# Patient Record
Sex: Female | Born: 1940 | Race: Black or African American | Hispanic: No | Marital: Married | Smoking: Never smoker
Health system: Southern US, Community
[De-identification: ages and names within clinical notes are randomized; demographics above are authoritative.]

## PROBLEM LIST (undated history)

## (undated) DIAGNOSIS — K219 Gastro-esophageal reflux disease without esophagitis: Secondary | ICD-10-CM

## (undated) DIAGNOSIS — C9000 Multiple myeloma not having achieved remission: Secondary | ICD-10-CM

## (undated) DIAGNOSIS — I10 Essential (primary) hypertension: Secondary | ICD-10-CM

## (undated) HISTORY — PX: ABDOMINAL HYSTERECTOMY: SHX81

## (undated) HISTORY — DX: Essential (primary) hypertension: I10

## (undated) HISTORY — DX: Gastro-esophageal reflux disease without esophagitis: K21.9

## (undated) HISTORY — PX: APPENDECTOMY: SHX54

## (undated) HISTORY — DX: Multiple myeloma not having achieved remission: C90.00

---

## 2003-11-15 ENCOUNTER — Ambulatory Visit: Payer: Self-pay | Admitting: Internal Medicine

## 2003-11-15 ENCOUNTER — Other Ambulatory Visit: Payer: Self-pay

## 2003-11-15 ENCOUNTER — Emergency Department: Payer: Self-pay | Admitting: Emergency Medicine

## 2003-11-18 ENCOUNTER — Inpatient Hospital Stay: Payer: Self-pay | Admitting: Internal Medicine

## 2003-12-16 ENCOUNTER — Ambulatory Visit: Payer: Self-pay | Admitting: Internal Medicine

## 2003-12-19 ENCOUNTER — Inpatient Hospital Stay: Payer: Self-pay | Admitting: Internal Medicine

## 2004-01-15 ENCOUNTER — Ambulatory Visit: Payer: Self-pay | Admitting: Internal Medicine

## 2004-01-28 ENCOUNTER — Ambulatory Visit: Payer: Self-pay | Admitting: Family Medicine

## 2004-02-15 ENCOUNTER — Ambulatory Visit: Payer: Self-pay | Admitting: Internal Medicine

## 2004-02-19 ENCOUNTER — Ambulatory Visit: Payer: Self-pay | Admitting: Neurological Surgery

## 2004-03-17 ENCOUNTER — Ambulatory Visit: Payer: Self-pay | Admitting: Internal Medicine

## 2004-05-11 ENCOUNTER — Ambulatory Visit: Payer: Self-pay | Admitting: Internal Medicine

## 2004-05-15 ENCOUNTER — Ambulatory Visit: Payer: Self-pay | Admitting: Internal Medicine

## 2004-06-14 ENCOUNTER — Ambulatory Visit: Payer: Self-pay | Admitting: Internal Medicine

## 2004-07-15 ENCOUNTER — Ambulatory Visit: Payer: Self-pay | Admitting: Internal Medicine

## 2004-08-06 ENCOUNTER — Ambulatory Visit: Payer: Self-pay | Admitting: Surgery

## 2004-08-14 ENCOUNTER — Ambulatory Visit: Payer: Self-pay | Admitting: Internal Medicine

## 2004-09-14 ENCOUNTER — Ambulatory Visit: Payer: Self-pay | Admitting: Internal Medicine

## 2004-10-15 ENCOUNTER — Ambulatory Visit: Payer: Self-pay | Admitting: Internal Medicine

## 2004-11-14 ENCOUNTER — Ambulatory Visit: Payer: Self-pay | Admitting: Internal Medicine

## 2004-11-18 ENCOUNTER — Inpatient Hospital Stay: Payer: Self-pay | Admitting: Internal Medicine

## 2004-12-15 ENCOUNTER — Ambulatory Visit: Payer: Self-pay | Admitting: Internal Medicine

## 2005-01-14 ENCOUNTER — Ambulatory Visit: Payer: Self-pay | Admitting: Internal Medicine

## 2005-02-14 ENCOUNTER — Ambulatory Visit: Payer: Self-pay | Admitting: Internal Medicine

## 2005-03-17 ENCOUNTER — Ambulatory Visit: Payer: Self-pay | Admitting: Internal Medicine

## 2005-04-14 ENCOUNTER — Ambulatory Visit: Payer: Self-pay | Admitting: Internal Medicine

## 2005-05-05 ENCOUNTER — Ambulatory Visit: Payer: Self-pay | Admitting: Family Medicine

## 2005-05-26 ENCOUNTER — Ambulatory Visit: Payer: Self-pay | Admitting: Family Medicine

## 2005-05-31 ENCOUNTER — Ambulatory Visit: Payer: Self-pay | Admitting: Internal Medicine

## 2005-06-09 ENCOUNTER — Inpatient Hospital Stay: Payer: Self-pay | Admitting: Internal Medicine

## 2005-06-14 ENCOUNTER — Ambulatory Visit: Payer: Self-pay | Admitting: Internal Medicine

## 2005-07-07 ENCOUNTER — Ambulatory Visit: Payer: Self-pay | Admitting: Pain Medicine

## 2005-07-15 ENCOUNTER — Ambulatory Visit: Payer: Self-pay | Admitting: Internal Medicine

## 2005-07-25 ENCOUNTER — Ambulatory Visit: Payer: Self-pay | Admitting: Pain Medicine

## 2005-08-14 ENCOUNTER — Ambulatory Visit: Payer: Self-pay | Admitting: Internal Medicine

## 2005-09-15 ENCOUNTER — Ambulatory Visit: Payer: Self-pay | Admitting: Internal Medicine

## 2005-10-02 ENCOUNTER — Emergency Department: Payer: Self-pay | Admitting: Emergency Medicine

## 2005-10-02 ENCOUNTER — Other Ambulatory Visit: Payer: Self-pay

## 2005-10-15 ENCOUNTER — Ambulatory Visit: Payer: Self-pay | Admitting: Internal Medicine

## 2005-11-01 ENCOUNTER — Encounter: Admission: RE | Admit: 2005-11-01 | Discharge: 2005-11-01 | Payer: Self-pay | Admitting: Neurosurgery

## 2005-11-10 ENCOUNTER — Emergency Department: Payer: Self-pay | Admitting: Emergency Medicine

## 2005-11-14 ENCOUNTER — Ambulatory Visit: Payer: Self-pay | Admitting: Internal Medicine

## 2005-11-18 ENCOUNTER — Other Ambulatory Visit: Payer: Self-pay

## 2005-11-18 ENCOUNTER — Emergency Department: Payer: Self-pay

## 2005-12-02 ENCOUNTER — Other Ambulatory Visit: Payer: Self-pay

## 2005-12-03 ENCOUNTER — Inpatient Hospital Stay: Payer: Self-pay | Admitting: Internal Medicine

## 2005-12-15 ENCOUNTER — Ambulatory Visit: Payer: Self-pay | Admitting: Internal Medicine

## 2006-01-14 ENCOUNTER — Ambulatory Visit: Payer: Self-pay | Admitting: Internal Medicine

## 2006-02-14 ENCOUNTER — Ambulatory Visit: Payer: Self-pay | Admitting: Internal Medicine

## 2006-03-17 ENCOUNTER — Ambulatory Visit: Payer: Self-pay | Admitting: Internal Medicine

## 2006-05-15 ENCOUNTER — Ambulatory Visit: Payer: Self-pay | Admitting: Family Medicine

## 2006-06-02 ENCOUNTER — Ambulatory Visit: Payer: Self-pay | Admitting: Internal Medicine

## 2006-06-15 ENCOUNTER — Ambulatory Visit: Payer: Self-pay | Admitting: Internal Medicine

## 2006-08-15 ENCOUNTER — Ambulatory Visit: Payer: Self-pay | Admitting: Internal Medicine

## 2006-08-28 ENCOUNTER — Ambulatory Visit: Payer: Self-pay | Admitting: Internal Medicine

## 2006-09-02 ENCOUNTER — Emergency Department: Payer: Self-pay | Admitting: Emergency Medicine

## 2006-09-15 ENCOUNTER — Ambulatory Visit: Payer: Self-pay | Admitting: Internal Medicine

## 2006-11-20 ENCOUNTER — Ambulatory Visit: Payer: Self-pay | Admitting: Internal Medicine

## 2006-12-16 ENCOUNTER — Ambulatory Visit: Payer: Self-pay | Admitting: Internal Medicine

## 2007-02-15 ENCOUNTER — Ambulatory Visit: Payer: Self-pay | Admitting: Internal Medicine

## 2007-02-20 ENCOUNTER — Ambulatory Visit: Payer: Self-pay | Admitting: Internal Medicine

## 2007-03-02 ENCOUNTER — Ambulatory Visit: Payer: Self-pay | Admitting: Family Medicine

## 2007-03-18 ENCOUNTER — Ambulatory Visit: Payer: Self-pay | Admitting: Internal Medicine

## 2007-05-15 ENCOUNTER — Ambulatory Visit: Payer: Self-pay | Admitting: Internal Medicine

## 2007-05-16 ENCOUNTER — Ambulatory Visit: Payer: Self-pay | Admitting: Internal Medicine

## 2007-05-17 ENCOUNTER — Ambulatory Visit: Payer: Self-pay | Admitting: Family Medicine

## 2007-05-22 ENCOUNTER — Ambulatory Visit: Payer: Self-pay | Admitting: Family Medicine

## 2007-07-16 ENCOUNTER — Ambulatory Visit: Payer: Self-pay | Admitting: Internal Medicine

## 2007-08-07 ENCOUNTER — Ambulatory Visit: Payer: Self-pay | Admitting: Internal Medicine

## 2007-08-15 ENCOUNTER — Ambulatory Visit: Payer: Self-pay | Admitting: Internal Medicine

## 2007-09-15 ENCOUNTER — Ambulatory Visit: Payer: Self-pay | Admitting: Internal Medicine

## 2007-10-16 ENCOUNTER — Ambulatory Visit: Payer: Self-pay | Admitting: Internal Medicine

## 2007-11-15 ENCOUNTER — Ambulatory Visit: Payer: Self-pay | Admitting: Internal Medicine

## 2007-11-19 ENCOUNTER — Ambulatory Visit: Payer: Self-pay | Admitting: Surgery

## 2007-12-06 ENCOUNTER — Ambulatory Visit: Payer: Self-pay | Admitting: Family Medicine

## 2008-01-18 ENCOUNTER — Emergency Department: Payer: Self-pay | Admitting: Emergency Medicine

## 2008-01-24 ENCOUNTER — Ambulatory Visit: Payer: Self-pay | Admitting: Gastroenterology

## 2008-02-15 ENCOUNTER — Ambulatory Visit: Payer: Self-pay | Admitting: Internal Medicine

## 2008-02-20 ENCOUNTER — Ambulatory Visit: Payer: Self-pay | Admitting: Internal Medicine

## 2008-03-17 ENCOUNTER — Ambulatory Visit: Payer: Self-pay | Admitting: Internal Medicine

## 2008-05-23 ENCOUNTER — Ambulatory Visit: Payer: Self-pay | Admitting: Surgery

## 2008-06-11 ENCOUNTER — Ambulatory Visit: Payer: Self-pay | Admitting: Internal Medicine

## 2008-06-14 ENCOUNTER — Ambulatory Visit: Payer: Self-pay | Admitting: Internal Medicine

## 2008-08-14 ENCOUNTER — Ambulatory Visit: Payer: Self-pay | Admitting: Internal Medicine

## 2008-08-21 ENCOUNTER — Ambulatory Visit: Payer: Self-pay | Admitting: Internal Medicine

## 2008-09-14 ENCOUNTER — Ambulatory Visit: Payer: Self-pay | Admitting: Internal Medicine

## 2008-10-15 ENCOUNTER — Ambulatory Visit: Payer: Self-pay | Admitting: Internal Medicine

## 2008-11-14 ENCOUNTER — Ambulatory Visit: Payer: Self-pay | Admitting: Internal Medicine

## 2009-01-14 ENCOUNTER — Ambulatory Visit: Payer: Self-pay | Admitting: Internal Medicine

## 2009-01-22 ENCOUNTER — Ambulatory Visit: Payer: Self-pay | Admitting: Internal Medicine

## 2009-02-14 ENCOUNTER — Ambulatory Visit: Payer: Self-pay | Admitting: Internal Medicine

## 2009-03-17 ENCOUNTER — Ambulatory Visit: Payer: Self-pay | Admitting: Internal Medicine

## 2009-04-14 ENCOUNTER — Ambulatory Visit: Payer: Self-pay | Admitting: Internal Medicine

## 2009-05-14 ENCOUNTER — Ambulatory Visit: Payer: Self-pay | Admitting: Family Medicine

## 2009-05-15 ENCOUNTER — Ambulatory Visit: Payer: Self-pay | Admitting: Internal Medicine

## 2009-06-14 ENCOUNTER — Ambulatory Visit: Payer: Self-pay | Admitting: Internal Medicine

## 2009-10-15 ENCOUNTER — Ambulatory Visit: Payer: Self-pay | Admitting: Internal Medicine

## 2009-10-28 ENCOUNTER — Ambulatory Visit: Payer: Self-pay | Admitting: Nurse Practitioner

## 2009-11-14 ENCOUNTER — Ambulatory Visit: Payer: Self-pay | Admitting: Nurse Practitioner

## 2009-11-14 ENCOUNTER — Ambulatory Visit: Payer: Self-pay | Admitting: Internal Medicine

## 2009-12-29 ENCOUNTER — Ambulatory Visit: Payer: Self-pay | Admitting: Gastroenterology

## 2010-03-18 ENCOUNTER — Ambulatory Visit: Payer: Self-pay | Admitting: Gastroenterology

## 2010-04-22 ENCOUNTER — Ambulatory Visit: Payer: Self-pay | Admitting: Internal Medicine

## 2010-05-16 ENCOUNTER — Ambulatory Visit: Payer: Self-pay | Admitting: Internal Medicine

## 2010-06-30 ENCOUNTER — Ambulatory Visit: Payer: Self-pay | Admitting: Family Medicine

## 2010-09-30 ENCOUNTER — Ambulatory Visit: Payer: Self-pay | Admitting: Internal Medicine

## 2010-10-16 ENCOUNTER — Ambulatory Visit: Payer: Self-pay | Admitting: Internal Medicine

## 2010-11-15 ENCOUNTER — Ambulatory Visit: Payer: Self-pay | Admitting: Internal Medicine

## 2010-12-16 ENCOUNTER — Ambulatory Visit: Payer: Self-pay | Admitting: Internal Medicine

## 2011-02-23 ENCOUNTER — Ambulatory Visit: Payer: Self-pay | Admitting: Internal Medicine

## 2011-02-23 LAB — CBC CANCER CENTER
Basophil %: 0.5 %
Eosinophil #: 0.1 x10 3/mm (ref 0.0–0.7)
MCH: 30.3 pg (ref 26.0–34.0)
MCHC: 32 g/dL (ref 32.0–36.0)
Monocyte #: 0.5 x10 3/mm (ref 0.0–0.7)
Neutrophil #: 3.1 x10 3/mm (ref 1.4–6.5)
Neutrophil %: 51.7 %
RDW: 13.5 % (ref 11.5–14.5)

## 2011-02-23 LAB — CREATININE, SERUM: Creatinine: 1.21 mg/dL (ref 0.60–1.30)

## 2011-02-25 LAB — URINE IEP, RANDOM

## 2011-03-18 ENCOUNTER — Ambulatory Visit: Payer: Self-pay | Admitting: Internal Medicine

## 2011-04-08 ENCOUNTER — Ambulatory Visit: Payer: Self-pay | Admitting: Family Medicine

## 2011-05-26 ENCOUNTER — Ambulatory Visit: Payer: Self-pay | Admitting: Internal Medicine

## 2011-07-08 ENCOUNTER — Ambulatory Visit: Payer: Self-pay | Admitting: Family Medicine

## 2011-08-25 ENCOUNTER — Ambulatory Visit: Payer: Self-pay | Admitting: Internal Medicine

## 2011-08-25 LAB — HEPATIC FUNCTION PANEL A (ARMC)
Albumin: 3.5 g/dL
Alkaline Phosphatase: 93 U/L
Bilirubin, Direct: 0.1 mg/dL
Bilirubin,Total: 0.2 mg/dL
SGOT(AST): 24 U/L
SGPT (ALT): 25 U/L
Total Protein: 7.5 g/dL

## 2011-08-25 LAB — BASIC METABOLIC PANEL WITH GFR
Anion Gap: 7
BUN: 16 mg/dL
Calcium, Total: 9.2 mg/dL
Chloride: 101 mmol/L
Co2: 32 mmol/L
Creatinine: 1.01 mg/dL
EGFR (African American): >60
EGFR (Non-African Amer.): 57 — ABNORMAL LOW
Glucose: 105 mg/dL — ABNORMAL HIGH
Osmolality: 281
Potassium: 3.4 mmol/L — ABNORMAL LOW
Sodium: 140 mmol/L

## 2011-08-25 LAB — CBC CANCER CENTER
Eosinophil %: 1.3 %
HCT: 37.2 % (ref 35.0–47.0)
Lymphocyte #: 2.5 x10 3/mm (ref 1.0–3.6)
MCH: 30.4 pg (ref 26.0–34.0)
MCV: 94 fL (ref 80–100)
Monocyte %: 7.8 %
Neutrophil #: 2.5 x10 3/mm (ref 1.4–6.5)
RBC: 3.97 10*6/uL (ref 3.80–5.20)
RDW: 13.8 % (ref 11.5–14.5)

## 2011-08-29 LAB — PROT IMMUNOELECTROPHORES(ARMC)

## 2011-09-15 ENCOUNTER — Ambulatory Visit: Payer: Self-pay | Admitting: Internal Medicine

## 2011-10-25 ENCOUNTER — Ambulatory Visit: Payer: Self-pay | Admitting: Family Medicine

## 2011-11-17 ENCOUNTER — Ambulatory Visit: Payer: Self-pay | Admitting: Internal Medicine

## 2011-11-17 LAB — BASIC METABOLIC PANEL
Anion Gap: 6 — ABNORMAL LOW (ref 7–16)
BUN: 14 mg/dL (ref 7–18)
Chloride: 103 mmol/L (ref 98–107)
Co2: 33 mmol/L — ABNORMAL HIGH (ref 21–32)
Creatinine: 1.16 mg/dL (ref 0.60–1.30)
Osmolality: 284 (ref 275–301)
Potassium: 3.9 mmol/L (ref 3.5–5.1)

## 2011-11-17 LAB — CBC CANCER CENTER
Basophil %: 0.5 %
HCT: 37 % (ref 35.0–47.0)
HGB: 12 g/dL (ref 12.0–16.0)
Lymphocyte #: 1.9 x10 3/mm (ref 1.0–3.6)
MCH: 30.6 pg (ref 26.0–34.0)
MCHC: 32.4 g/dL (ref 32.0–36.0)
MCV: 94 fL (ref 80–100)
Neutrophil #: 2 x10 3/mm (ref 1.4–6.5)
Neutrophil %: 46 %

## 2011-11-18 LAB — PROT IMMUNOELECTROPHORES(ARMC)

## 2011-12-16 ENCOUNTER — Ambulatory Visit: Payer: Self-pay | Admitting: Internal Medicine

## 2012-02-02 ENCOUNTER — Ambulatory Visit: Payer: Self-pay | Admitting: Internal Medicine

## 2012-02-02 LAB — HEPATIC FUNCTION PANEL A (ARMC)
Albumin: 3.9 g/dL (ref 3.4–5.0)
Bilirubin, Direct: 0.1 mg/dL (ref 0.00–0.20)
SGOT(AST): 21 U/L (ref 15–37)

## 2012-02-02 LAB — CBC CANCER CENTER
Basophil #: 0 x10 3/mm (ref 0.0–0.1)
Eosinophil %: 0.7 %
Lymphocyte #: 2.6 x10 3/mm (ref 1.0–3.6)
Lymphocyte %: 43.4 %
Monocyte %: 8 %
Neutrophil %: 47.2 %
Platelet: 194 x10 3/mm (ref 150–440)
RBC: 4.15 10*6/uL (ref 3.80–5.20)
RDW: 14.2 % (ref 11.5–14.5)
WBC: 5.9 x10 3/mm (ref 3.6–11.0)

## 2012-02-02 LAB — BASIC METABOLIC PANEL
Anion Gap: 5 — ABNORMAL LOW (ref 7–16)
BUN: 25 mg/dL — ABNORMAL HIGH (ref 7–18)
EGFR (African American): 57 — ABNORMAL LOW
EGFR (Non-African Amer.): 49 — ABNORMAL LOW
Glucose: 100 mg/dL — ABNORMAL HIGH (ref 65–99)
Osmolality: 284 (ref 275–301)
Potassium: 3.8 mmol/L (ref 3.5–5.1)

## 2012-02-03 LAB — PROT IMMUNOELECTROPHORES(ARMC)

## 2012-02-15 ENCOUNTER — Ambulatory Visit: Payer: Self-pay | Admitting: Internal Medicine

## 2012-02-23 LAB — PROT IMMUNOELECT,UR-24HR

## 2012-03-15 LAB — CBC CANCER CENTER
Basophil #: 0.1 x10 3/mm (ref 0.0–0.1)
Eosinophil #: 0.1 x10 3/mm (ref 0.0–0.7)
HGB: 11.2 g/dL — ABNORMAL LOW (ref 12.0–16.0)
Lymphocyte %: 37 %
MCH: 30.5 pg (ref 26.0–34.0)
Monocyte #: 0.4 x10 3/mm (ref 0.2–0.9)
Monocyte %: 9.1 %
Neutrophil #: 2.3 x10 3/mm (ref 1.4–6.5)
Neutrophil %: 50 %
Platelet: 155 x10 3/mm (ref 150–440)

## 2012-03-15 LAB — COMPREHENSIVE METABOLIC PANEL
Albumin: 3.6 g/dL (ref 3.4–5.0)
Alkaline Phosphatase: 70 U/L (ref 50–136)
Calcium, Total: 9.1 mg/dL (ref 8.5–10.1)
Osmolality: 281 (ref 275–301)
SGOT(AST): 22 U/L (ref 15–37)
SGPT (ALT): 42 U/L (ref 12–78)
Sodium: 140 mmol/L (ref 136–145)
Total Protein: 8.2 g/dL (ref 6.4–8.2)

## 2012-03-17 ENCOUNTER — Ambulatory Visit: Payer: Self-pay | Admitting: Internal Medicine

## 2012-04-02 LAB — CBC CANCER CENTER
Basophil #: 0.1 x10 3/mm (ref 0.0–0.1)
Basophil %: 1.4 %
Eosinophil #: 0.2 x10 3/mm (ref 0.0–0.7)
Eosinophil %: 5.4 %
HCT: 33.8 % — ABNORMAL LOW (ref 35.0–47.0)
Lymphocyte #: 1.3 x10 3/mm (ref 1.0–3.6)
Lymphocyte %: 35.3 %
MCHC: 32.7 g/dL (ref 32.0–36.0)
MCV: 95 fL (ref 80–100)
Monocyte #: 0.1 x10 3/mm — ABNORMAL LOW (ref 0.2–0.9)
Platelet: 137 x10 3/mm — ABNORMAL LOW (ref 150–440)
RBC: 3.57 10*6/uL — ABNORMAL LOW (ref 3.80–5.20)
WBC: 3.8 x10 3/mm (ref 3.6–11.0)

## 2012-04-02 LAB — POTASSIUM: Potassium: 3.1 mmol/L — ABNORMAL LOW (ref 3.5–5.1)

## 2012-04-02 LAB — MAGNESIUM: Magnesium: 2.1 mg/dL

## 2012-04-12 LAB — CBC CANCER CENTER
Basophil #: 0 x10 3/mm (ref 0.0–0.1)
Basophil %: 0.9 %
Eosinophil #: 0.5 x10 3/mm (ref 0.0–0.7)
Eosinophil %: 16.5 %
Lymphocyte #: 1.4 x10 3/mm (ref 1.0–3.6)
Lymphocyte %: 44.2 %
MCV: 95 fL (ref 80–100)
Monocyte #: 0.1 x10 3/mm — ABNORMAL LOW (ref 0.2–0.9)
Monocyte %: 4.8 %
Neutrophil #: 1 x10 3/mm — ABNORMAL LOW (ref 1.4–6.5)
Platelet: 106 x10 3/mm — ABNORMAL LOW (ref 150–440)

## 2012-04-14 ENCOUNTER — Ambulatory Visit: Payer: Self-pay | Admitting: Internal Medicine

## 2012-04-26 LAB — BASIC METABOLIC PANEL
Calcium, Total: 9.4 mg/dL (ref 8.5–10.1)
Chloride: 99 mmol/L (ref 98–107)
Creatinine: 1.2 mg/dL (ref 0.60–1.30)
Glucose: 121 mg/dL — ABNORMAL HIGH (ref 65–99)
Potassium: 3.5 mmol/L (ref 3.5–5.1)

## 2012-04-26 LAB — HEPATIC FUNCTION PANEL A (ARMC)
Alkaline Phosphatase: 77 U/L (ref 50–136)
Bilirubin, Direct: 0.1 mg/dL (ref 0.00–0.20)

## 2012-04-26 LAB — CBC CANCER CENTER
Basophil #: 0.1 x10 3/mm (ref 0.0–0.1)
Eosinophil #: 0.2 x10 3/mm (ref 0.0–0.7)
HCT: 34.5 % — ABNORMAL LOW (ref 35.0–47.0)
HGB: 11.6 g/dL — ABNORMAL LOW (ref 12.0–16.0)
Lymphocyte %: 52.2 %
MCHC: 33.8 g/dL (ref 32.0–36.0)
Monocyte #: 0.3 x10 3/mm (ref 0.2–0.9)
Monocyte %: 10.2 %
Neutrophil %: 29.4 %
Platelet: 209 x10 3/mm (ref 150–440)
RBC: 3.63 10*6/uL — ABNORMAL LOW (ref 3.80–5.20)
RDW: 16.8 % — ABNORMAL HIGH (ref 11.5–14.5)
WBC: 2.8 x10 3/mm — ABNORMAL LOW (ref 3.6–11.0)

## 2012-05-10 LAB — CBC CANCER CENTER
HCT: 37.2 % (ref 35.0–47.0)
Lymphocyte #: 1.9 x10 3/mm (ref 1.0–3.6)
MCV: 97 fL (ref 80–100)
Monocyte #: 0.5 x10 3/mm (ref 0.2–0.9)
Neutrophil #: 1.7 x10 3/mm (ref 1.4–6.5)
Neutrophil %: 40.3 %
Platelet: 208 x10 3/mm (ref 150–440)
RBC: 3.85 10*6/uL (ref 3.80–5.20)
WBC: 4.3 x10 3/mm (ref 3.6–11.0)

## 2012-05-10 LAB — POTASSIUM: Potassium: 3.4 mmol/L — ABNORMAL LOW (ref 3.5–5.1)

## 2012-05-15 ENCOUNTER — Ambulatory Visit: Payer: Self-pay | Admitting: Internal Medicine

## 2012-05-24 LAB — CBC CANCER CENTER
Basophil #: 0 x10 3/mm (ref 0.0–0.1)
Eosinophil #: 0.3 x10 3/mm (ref 0.0–0.7)
Eosinophil %: 6.7 %
HCT: 34.3 % — ABNORMAL LOW (ref 35.0–47.0)
Lymphocyte %: 35.3 %
MCV: 97 fL (ref 80–100)
Monocyte #: 0.7 x10 3/mm (ref 0.2–0.9)
Neutrophil #: 2 x10 3/mm (ref 1.4–6.5)
Platelet: 169 x10 3/mm (ref 150–440)
RBC: 3.53 10*6/uL — ABNORMAL LOW (ref 3.80–5.20)
RDW: 18 % — ABNORMAL HIGH (ref 11.5–14.5)

## 2012-06-07 LAB — CBC CANCER CENTER
Basophil #: 0.1 x10 3/mm (ref 0.0–0.1)
Eosinophil #: 0.1 x10 3/mm (ref 0.0–0.7)
Eosinophil %: 2.2 %
HGB: 11.7 g/dL — ABNORMAL LOW (ref 12.0–16.0)
Lymphocyte #: 1.9 x10 3/mm (ref 1.0–3.6)
Lymphocyte %: 40.7 %
MCHC: 33.6 g/dL (ref 32.0–36.0)
Monocyte %: 12.4 %
Neutrophil #: 2 x10 3/mm (ref 1.4–6.5)
Neutrophil %: 43 %
RDW: 17.7 % — ABNORMAL HIGH (ref 11.5–14.5)

## 2012-06-07 LAB — BASIC METABOLIC PANEL
Anion Gap: 6 — ABNORMAL LOW (ref 7–16)
BUN: 14 mg/dL (ref 7–18)
Calcium, Total: 8.8 mg/dL (ref 8.5–10.1)
Chloride: 102 mmol/L (ref 98–107)
Co2: 29 mmol/L (ref 21–32)
EGFR (Non-African Amer.): 42 — ABNORMAL LOW
Glucose: 121 mg/dL — ABNORMAL HIGH (ref 65–99)
Sodium: 137 mmol/L (ref 136–145)

## 2012-06-07 LAB — HEPATIC FUNCTION PANEL A (ARMC)
Bilirubin, Direct: 0.1 mg/dL (ref 0.00–0.20)
Bilirubin,Total: 0.3 mg/dL (ref 0.2–1.0)
SGPT (ALT): 30 U/L (ref 12–78)
Total Protein: 7.7 g/dL (ref 6.4–8.2)

## 2012-06-11 LAB — PROT IMMUNOELECTROPHORES(ARMC)

## 2012-06-14 ENCOUNTER — Ambulatory Visit: Payer: Self-pay | Admitting: Internal Medicine

## 2012-06-21 LAB — CBC CANCER CENTER
Basophil #: 0 x10 3/mm (ref 0.0–0.1)
Basophil %: 1.2 %
Eosinophil #: 0.6 x10 3/mm (ref 0.0–0.7)
Eosinophil %: 13.7 %
HCT: 34.2 % — ABNORMAL LOW (ref 35.0–47.0)
Lymphocyte #: 1.6 x10 3/mm (ref 1.0–3.6)
MCH: 32.2 pg (ref 26.0–34.0)
MCHC: 32.6 g/dL (ref 32.0–36.0)
Monocyte #: 0.2 x10 3/mm (ref 0.2–0.9)
Monocyte %: 5.4 %
Neutrophil %: 39.8 %
WBC: 4.1 x10 3/mm (ref 3.6–11.0)

## 2012-06-21 LAB — PHOSPHORUS: Phosphorus: 3.3 mg/dL (ref 2.5–4.9)

## 2012-06-21 LAB — POTASSIUM: Potassium: 4.6 mmol/L (ref 3.5–5.1)

## 2012-06-21 LAB — MAGNESIUM: Magnesium: 1.8 mg/dL

## 2012-06-25 ENCOUNTER — Emergency Department: Payer: Self-pay | Admitting: Emergency Medicine

## 2012-06-25 LAB — COMPREHENSIVE METABOLIC PANEL
Alkaline Phosphatase: 74 U/L (ref 50–136)
Bilirubin,Total: 0.4 mg/dL (ref 0.2–1.0)
Calcium, Total: 9.1 mg/dL (ref 8.5–10.1)
EGFR (Non-African Amer.): >60
Potassium: 3.5 mmol/L (ref 3.5–5.1)
SGOT(AST): 24 U/L (ref 15–37)
Sodium: 137 mmol/L (ref 136–145)

## 2012-06-25 LAB — URINALYSIS, COMPLETE
Blood: NEGATIVE
Glucose,UR: NEGATIVE mg/dL (ref 0–75)
Ketone: NEGATIVE
Nitrite: NEGATIVE
Ph: 6 (ref 4.5–8.0)
RBC,UR: 1 /HPF (ref 0–5)
Specific Gravity: 1.01 (ref 1.003–1.030)
Squamous Epithelial: NONE SEEN
WBC UR: 3 /HPF (ref 0–5)

## 2012-06-25 LAB — CBC WITH DIFFERENTIAL/PLATELET
Basophil #: 0 10*3/uL (ref 0.0–0.1)
Eosinophil %: 21.9 %
Lymphocyte #: 1.1 10*3/uL (ref 1.0–3.6)
MCH: 33.1 pg (ref 26.0–34.0)
MCHC: 33.8 g/dL (ref 32.0–36.0)
Monocyte %: 6.5 %
Neutrophil #: 1.7 10*3/uL (ref 1.4–6.5)
Neutrophil %: 42.4 %
Platelet: 84 10*3/uL — ABNORMAL LOW (ref 150–440)
RBC: 3.3 10*6/uL — ABNORMAL LOW (ref 3.80–5.20)

## 2012-06-25 LAB — LIPASE, BLOOD: Lipase: 69 U/L — ABNORMAL LOW (ref 73–393)

## 2012-07-05 LAB — CBC CANCER CENTER
Basophil %: 1.2 %
HCT: 33.4 % — ABNORMAL LOW (ref 35.0–47.0)
HGB: 11 g/dL — ABNORMAL LOW (ref 12.0–16.0)
MCV: 99 fL (ref 80–100)
Monocyte %: 9.4 %
Neutrophil #: 0.6 x10 3/mm — ABNORMAL LOW (ref 1.4–6.5)
Neutrophil %: 22.2 %
Platelet: 201 x10 3/mm (ref 150–440)
RBC: 3.36 10*6/uL — ABNORMAL LOW (ref 3.80–5.20)
RDW: 16.6 % — ABNORMAL HIGH (ref 11.5–14.5)

## 2012-07-10 ENCOUNTER — Ambulatory Visit: Payer: Self-pay | Admitting: Gastroenterology

## 2012-07-10 LAB — CBC WITH DIFFERENTIAL/PLATELET
Basophil #: 0 10*3/uL (ref 0.0–0.1)
Basophil %: 1.1 %
Eosinophil #: 0 10*3/uL (ref 0.0–0.7)
Eosinophil %: 1.1 %
HGB: 11.3 g/dL — ABNORMAL LOW (ref 12.0–16.0)
Lymphocyte #: 1.4 10*3/uL (ref 1.0–3.6)
MCH: 33.6 pg (ref 26.0–34.0)
Monocyte #: 0.3 x10 3/mm (ref 0.2–0.9)
Monocyte %: 10.9 %
Platelet: 243 10*3/uL (ref 150–440)
RBC: 3.36 10*6/uL — ABNORMAL LOW (ref 3.80–5.20)
RDW: 16.7 % — ABNORMAL HIGH (ref 11.5–14.5)

## 2012-07-15 ENCOUNTER — Ambulatory Visit: Payer: Self-pay | Admitting: Internal Medicine

## 2012-07-19 LAB — CBC CANCER CENTER
Basophil %: 0.5 %
Eosinophil #: 0 x10 3/mm (ref 0.0–0.7)
Eosinophil %: 0.8 %
HGB: 10.8 g/dL — ABNORMAL LOW (ref 12.0–16.0)
Lymphocyte #: 2.2 x10 3/mm (ref 1.0–3.6)
MCH: 33.1 pg (ref 26.0–34.0)
MCHC: 33.3 g/dL (ref 32.0–36.0)
MCV: 100 fL (ref 80–100)
Monocyte #: 0.4 x10 3/mm (ref 0.2–0.9)
Monocyte %: 10.3 %
Neutrophil #: 1.5 x10 3/mm (ref 1.4–6.5)
Neutrophil %: 36.3 %
RBC: 3.25 10*6/uL — ABNORMAL LOW (ref 3.80–5.20)

## 2012-07-19 LAB — BASIC METABOLIC PANEL
Anion Gap: 7 (ref 7–16)
Calcium, Total: 9.2 mg/dL (ref 8.5–10.1)
Co2: 34 mmol/L — ABNORMAL HIGH (ref 21–32)
EGFR (Non-African Amer.): 51 — ABNORMAL LOW
Glucose: 114 mg/dL — ABNORMAL HIGH (ref 65–99)
Osmolality: 288 (ref 275–301)
Sodium: 143 mmol/L (ref 136–145)

## 2012-07-19 LAB — HEPATIC FUNCTION PANEL A (ARMC)
Albumin: 3.5 g/dL (ref 3.4–5.0)
Alkaline Phosphatase: 66 U/L (ref 50–136)
Bilirubin, Direct: <0.05 mg/dL (ref 0.00–0.20)
Bilirubin,Total: 0.3 mg/dL (ref 0.2–1.0)
SGOT(AST): 17 U/L (ref 15–37)
SGPT (ALT): 19 U/L (ref 12–78)
Total Protein: 7.3 g/dL (ref 6.4–8.2)

## 2012-07-19 LAB — MAGNESIUM: Magnesium: 1.6 mg/dL — ABNORMAL LOW

## 2012-07-31 ENCOUNTER — Ambulatory Visit: Payer: Self-pay | Admitting: Gastroenterology

## 2012-07-31 LAB — PROTIME-INR: Prothrombin Time: 13.3 secs (ref 11.5–14.7)

## 2012-07-31 LAB — CBC WITH DIFFERENTIAL/PLATELET
Basophil #: 0 10*3/uL (ref 0.0–0.1)
Eosinophil %: 0.4 %
HCT: 33.3 % — ABNORMAL LOW (ref 35.0–47.0)
HGB: 11.3 g/dL — ABNORMAL LOW (ref 12.0–16.0)
Lymphocyte #: 1.5 10*3/uL (ref 1.0–3.6)
Lymphocyte %: 32.2 %
MCV: 99 fL (ref 80–100)
Monocyte %: 12 %
Neutrophil #: 2.5 10*3/uL (ref 1.4–6.5)
Neutrophil %: 55.1 %
RBC: 3.36 10*6/uL — ABNORMAL LOW (ref 3.80–5.20)
RDW: 16.2 % — ABNORMAL HIGH (ref 11.5–14.5)

## 2012-07-31 LAB — HM COLONOSCOPY: HM Colonoscopy: NORMAL

## 2012-08-03 LAB — PATHOLOGY REPORT

## 2012-08-09 LAB — CBC CANCER CENTER
Basophil %: 0.4 %
Eosinophil #: 0.1 x10 3/mm (ref 0.0–0.7)
HCT: 33.1 % — ABNORMAL LOW (ref 35.0–47.0)
MCH: 33.2 pg (ref 26.0–34.0)
MCHC: 33.3 g/dL (ref 32.0–36.0)
MCV: 100 fL (ref 80–100)
Monocyte %: 6.3 %
Neutrophil #: 3.5 x10 3/mm (ref 1.4–6.5)
Neutrophil %: 64 %
Platelet: 171 x10 3/mm (ref 150–440)
RBC: 3.32 10*6/uL — ABNORMAL LOW (ref 3.80–5.20)
WBC: 5.5 x10 3/mm (ref 3.6–11.0)

## 2012-08-09 LAB — BASIC METABOLIC PANEL
Anion Gap: 6 — ABNORMAL LOW (ref 7–16)
Chloride: 102 mmol/L (ref 98–107)
Creatinine: 1 mg/dL (ref 0.60–1.30)
EGFR (African American): >60
Osmolality: 282 (ref 275–301)
Potassium: 3.3 mmol/L — ABNORMAL LOW (ref 3.5–5.1)
Sodium: 140 mmol/L (ref 136–145)

## 2012-08-14 ENCOUNTER — Ambulatory Visit: Payer: Self-pay | Admitting: Internal Medicine

## 2012-08-19 ENCOUNTER — Ambulatory Visit: Payer: Self-pay | Admitting: Family Medicine

## 2012-08-19 LAB — URINALYSIS, COMPLETE
Bilirubin,UR: NEGATIVE
Ketone: NEGATIVE
Leukocyte Esterase: NEGATIVE
Nitrite: NEGATIVE
Ph: 7.5 (ref 4.5–8.0)

## 2012-08-22 ENCOUNTER — Emergency Department: Payer: Self-pay | Admitting: Emergency Medicine

## 2012-08-22 LAB — COMPREHENSIVE METABOLIC PANEL
Albumin: 4.3 g/dL (ref 3.4–5.0)
Alkaline Phosphatase: 92 U/L (ref 50–136)
Anion Gap: 6 — ABNORMAL LOW (ref 7–16)
BUN: 14 mg/dL (ref 7–18)
Bilirubin,Total: 0.4 mg/dL (ref 0.2–1.0)
Calcium, Total: 9.1 mg/dL (ref 8.5–10.1)
Co2: 31 mmol/L (ref 21–32)
EGFR (African American): 43 — ABNORMAL LOW
Glucose: 159 mg/dL — ABNORMAL HIGH (ref 65–99)
Osmolality: 268 (ref 275–301)
SGOT(AST): 28 U/L (ref 15–37)
SGPT (ALT): 35 U/L (ref 12–78)
Sodium: 132 mmol/L — ABNORMAL LOW (ref 136–145)
Total Protein: 8.8 g/dL — ABNORMAL HIGH (ref 6.4–8.2)

## 2012-08-22 LAB — CBC
HCT: 36.4 % (ref 35.0–47.0)
HGB: 12 g/dL (ref 12.0–16.0)
MCV: 100 fL (ref 80–100)
RBC: 3.62 10*6/uL — ABNORMAL LOW (ref 3.80–5.20)
WBC: 4.5 10*3/uL (ref 3.6–11.0)

## 2012-08-22 LAB — URINALYSIS, COMPLETE
Blood: NEGATIVE
Glucose,UR: NEGATIVE mg/dL (ref 0–75)
Ketone: NEGATIVE
Squamous Epithelial: <1

## 2012-08-22 LAB — LIPASE, BLOOD: Lipase: 62 U/L — ABNORMAL LOW (ref 73–393)

## 2012-08-27 ENCOUNTER — Ambulatory Visit: Payer: Self-pay | Admitting: Gastroenterology

## 2012-08-27 ENCOUNTER — Ambulatory Visit: Payer: Self-pay | Admitting: Family Medicine

## 2012-08-27 LAB — CBC WITH DIFFERENTIAL/PLATELET
Basophil %: 1.5 %
Eosinophil %: 1.8 %
HCT: 36.7 % (ref 35.0–47.0)
HGB: 12.2 g/dL (ref 12.0–16.0)
Neutrophil #: 1.3 10*3/uL — ABNORMAL LOW (ref 1.4–6.5)
RBC: 3.68 10*6/uL — ABNORMAL LOW (ref 3.80–5.20)

## 2012-08-30 LAB — CBC CANCER CENTER
Eosinophil #: 0.1 x10 3/mm (ref 0.0–0.7)
Lymphocyte #: 1.3 x10 3/mm (ref 1.0–3.6)
Lymphocyte %: 38.1 %
MCHC: 33.1 g/dL (ref 32.0–36.0)
Monocyte #: 0.5 x10 3/mm (ref 0.2–0.9)
Neutrophil #: 1.4 x10 3/mm (ref 1.4–6.5)
Platelet: 222 x10 3/mm (ref 150–440)
RBC: 3.57 10*6/uL — ABNORMAL LOW (ref 3.80–5.20)
RDW: 15.8 % — ABNORMAL HIGH (ref 11.5–14.5)

## 2012-08-30 LAB — POTASSIUM: Potassium: 3.4 mmol/L — ABNORMAL LOW (ref 3.5–5.1)

## 2012-08-30 LAB — MAGNESIUM: Magnesium: 1.8 mg/dL

## 2012-09-05 ENCOUNTER — Ambulatory Visit: Payer: Self-pay | Admitting: Gastroenterology

## 2012-09-10 ENCOUNTER — Ambulatory Visit: Payer: Self-pay | Admitting: Gastroenterology

## 2012-09-13 LAB — CBC CANCER CENTER
Basophil #: 0 x10 3/mm (ref 0.0–0.1)
Eosinophil %: 15.4 %
HGB: 11.3 g/dL — ABNORMAL LOW (ref 12.0–16.0)
Lymphocyte %: 34.6 %
MCHC: 33.6 g/dL (ref 32.0–36.0)
Neutrophil %: 40.8 %
Platelet: 102 x10 3/mm — ABNORMAL LOW (ref 150–440)
RBC: 3.37 10*6/uL — ABNORMAL LOW (ref 3.80–5.20)
WBC: 4.1 x10 3/mm (ref 3.6–11.0)

## 2012-09-14 ENCOUNTER — Ambulatory Visit: Payer: Self-pay | Admitting: Internal Medicine

## 2012-09-27 LAB — CBC CANCER CENTER
Basophil #: 0 x10 3/mm (ref 0.0–0.1)
Basophil %: 1.5 %
Eosinophil #: 0.1 x10 3/mm (ref 0.0–0.7)
Eosinophil %: 3.3 %
HCT: 31.7 % — ABNORMAL LOW (ref 35.0–47.0)
Lymphocyte %: 49.6 %
MCHC: 34.2 g/dL (ref 32.0–36.0)
MCV: 100 fL (ref 80–100)
Monocyte %: 7.7 %
Neutrophil #: 1.1 x10 3/mm — ABNORMAL LOW (ref 1.4–6.5)
Neutrophil %: 37.9 %
Platelet: 180 x10 3/mm (ref 150–440)
RBC: 3.19 10*6/uL — ABNORMAL LOW (ref 3.80–5.20)
WBC: 3 x10 3/mm — ABNORMAL LOW (ref 3.6–11.0)

## 2012-09-27 LAB — COMPREHENSIVE METABOLIC PANEL
BUN: 14 mg/dL (ref 7–18)
Co2: 35 mmol/L — ABNORMAL HIGH (ref 21–32)
Creatinine: 1.03 mg/dL (ref 0.60–1.30)
EGFR (Non-African Amer.): 55 — ABNORMAL LOW
Osmolality: 279 (ref 275–301)
SGPT (ALT): 49 U/L (ref 12–78)
Sodium: 139 mmol/L (ref 136–145)
Total Protein: 7.7 g/dL (ref 6.4–8.2)

## 2012-09-27 LAB — PHOSPHORUS: Phosphorus: 3.7 mg/dL (ref 2.5–4.9)

## 2012-09-27 LAB — MAGNESIUM: Magnesium: 1.8 mg/dL

## 2012-10-08 ENCOUNTER — Ambulatory Visit: Payer: Self-pay | Admitting: Gastroenterology

## 2012-10-11 LAB — CBC CANCER CENTER
Eosinophil %: 8.6 %
HCT: 32 % — ABNORMAL LOW (ref 35.0–47.0)
Lymphocyte #: 1.3 x10 3/mm (ref 1.0–3.6)
Lymphocyte %: 53.6 %
MCHC: 33.3 g/dL (ref 32.0–36.0)
MCV: 99 fL (ref 80–100)
Monocyte #: 0.2 x10 3/mm (ref 0.2–0.9)
Monocyte %: 9.2 %
Platelet: 161 x10 3/mm (ref 150–440)
RBC: 3.22 10*6/uL — ABNORMAL LOW (ref 3.80–5.20)
RDW: 16.3 % — ABNORMAL HIGH (ref 11.5–14.5)

## 2012-10-11 LAB — CREATININE, SERUM: EGFR (African American): >60

## 2012-10-11 LAB — POTASSIUM: Potassium: 3.4 mmol/L — ABNORMAL LOW (ref 3.5–5.1)

## 2012-10-15 ENCOUNTER — Ambulatory Visit: Payer: Self-pay | Admitting: Internal Medicine

## 2012-10-18 LAB — CBC CANCER CENTER
Basophil #: 0 x10 3/mm (ref 0.0–0.1)
Eosinophil %: 2.2 %
HCT: 34 % — ABNORMAL LOW (ref 35.0–47.0)
Lymphocyte #: 1.7 x10 3/mm (ref 1.0–3.6)
Lymphocyte %: 52.9 %
MCH: 32.9 pg (ref 26.0–34.0)
MCHC: 32.8 g/dL (ref 32.0–36.0)
Neutrophil #: 1 x10 3/mm — ABNORMAL LOW (ref 1.4–6.5)
Neutrophil %: 30.1 %
RBC: 3.39 10*6/uL — ABNORMAL LOW (ref 3.80–5.20)
WBC: 3.2 x10 3/mm — ABNORMAL LOW (ref 3.6–11.0)

## 2012-10-25 LAB — CBC CANCER CENTER
Basophil #: 0 x10 3/mm (ref 0.0–0.1)
HCT: 34 % — ABNORMAL LOW (ref 35.0–47.0)
HGB: 11 g/dL — ABNORMAL LOW (ref 12.0–16.0)
Lymphocyte #: 1.2 x10 3/mm (ref 1.0–3.6)
Lymphocyte %: 37.1 %
MCHC: 32.4 g/dL (ref 32.0–36.0)
Monocyte %: 7.1 %
Neutrophil #: 1.7 x10 3/mm (ref 1.4–6.5)
RBC: 3.38 10*6/uL — ABNORMAL LOW (ref 3.80–5.20)
RDW: 17.3 % — ABNORMAL HIGH (ref 11.5–14.5)
WBC: 3.1 x10 3/mm — ABNORMAL LOW (ref 3.6–11.0)

## 2012-10-29 LAB — PROT IMMUNOELECTROPHORES(ARMC)

## 2012-11-08 LAB — CBC CANCER CENTER
Basophil #: 0 x10 3/mm (ref 0.0–0.1)
Basophil %: 0.7 %
Eosinophil #: 0.3 x10 3/mm (ref 0.0–0.7)
Eosinophil %: 9 %
HCT: 36.6 % (ref 35.0–47.0)
Lymphocyte #: 1.6 x10 3/mm (ref 1.0–3.6)
Lymphocyte %: 43.5 %
MCH: 32.3 pg (ref 26.0–34.0)
Monocyte %: 6.6 %
Neutrophil #: 1.5 x10 3/mm (ref 1.4–6.5)
Neutrophil %: 40.2 %
Platelet: 153 x10 3/mm (ref 150–440)
RBC: 3.65 10*6/uL — ABNORMAL LOW (ref 3.80–5.20)
RDW: 16.8 % — ABNORMAL HIGH (ref 11.5–14.5)
WBC: 3.7 x10 3/mm (ref 3.6–11.0)

## 2012-11-08 LAB — BASIC METABOLIC PANEL
Anion Gap: 7 (ref 7–16)
BUN: 14 mg/dL (ref 7–18)
Calcium, Total: 9.2 mg/dL (ref 8.5–10.1)
Co2: 35 mmol/L — ABNORMAL HIGH (ref 21–32)
Creatinine: 1.11 mg/dL (ref 0.60–1.30)
Glucose: 124 mg/dL — ABNORMAL HIGH (ref 65–99)
Osmolality: 281 (ref 275–301)
Potassium: 3.3 mmol/L — ABNORMAL LOW (ref 3.5–5.1)

## 2012-11-08 LAB — HEPATIC FUNCTION PANEL A (ARMC)
Albumin: 3.6 g/dL (ref 3.4–5.0)
Alkaline Phosphatase: 105 U/L (ref 50–136)
Bilirubin, Direct: 0.1 mg/dL (ref 0.00–0.20)
Bilirubin,Total: 0.5 mg/dL (ref 0.2–1.0)
Total Protein: 8.1 g/dL (ref 6.4–8.2)

## 2012-11-08 LAB — MAGNESIUM: Magnesium: 2 mg/dL

## 2012-11-14 ENCOUNTER — Ambulatory Visit: Payer: Self-pay | Admitting: Internal Medicine

## 2012-11-22 LAB — CBC CANCER CENTER
Basophil #: 0 x10 3/mm (ref 0.0–0.1)
Eosinophil %: 6.3 %
HCT: 35.8 % (ref 35.0–47.0)
Lymphocyte #: 1.9 x10 3/mm (ref 1.0–3.6)
Monocyte %: 10.4 %
RBC: 3.54 10*6/uL — ABNORMAL LOW (ref 3.80–5.20)
RDW: 16.6 % — ABNORMAL HIGH (ref 11.5–14.5)
WBC: 4.3 x10 3/mm (ref 3.6–11.0)

## 2012-11-22 LAB — URINALYSIS, COMPLETE
Glucose,UR: 100 mg/dL (ref 0–75)
Ketone: NEGATIVE
Ph: 6 (ref 4.5–8.0)
Protein: 100
Specific Gravity: 1.02 (ref 1.003–1.030)

## 2012-12-06 LAB — CBC CANCER CENTER
Basophil #: 0 x10 3/mm (ref 0.0–0.1)
Basophil %: 0.9 %
Eosinophil %: 5.7 %
HCT: 35 % (ref 35.0–47.0)
HGB: 11.2 g/dL — ABNORMAL LOW (ref 12.0–16.0)
MCH: 32.2 pg (ref 26.0–34.0)
MCHC: 32.1 g/dL (ref 32.0–36.0)
MCV: 100 fL (ref 80–100)
Monocyte %: 4.9 %
Neutrophil #: 1.3 x10 3/mm — ABNORMAL LOW (ref 1.4–6.5)
Platelet: 172 x10 3/mm (ref 150–440)
RBC: 3.49 10*6/uL — ABNORMAL LOW (ref 3.80–5.20)

## 2012-12-06 LAB — CREATININE, SERUM: EGFR (African American): >60

## 2012-12-06 LAB — POTASSIUM: Potassium: 3.3 mmol/L — ABNORMAL LOW (ref 3.5–5.1)

## 2012-12-15 ENCOUNTER — Ambulatory Visit: Payer: Self-pay | Admitting: Internal Medicine

## 2012-12-20 LAB — CBC CANCER CENTER
Basophil #: 0 x10 3/mm (ref 0.0–0.1)
Basophil %: 0.8 %
Eosinophil #: 0.2 x10 3/mm (ref 0.0–0.7)
HGB: 10.6 g/dL — ABNORMAL LOW (ref 12.0–16.0)
Lymphocyte %: 58.4 %
MCH: 32.9 pg (ref 26.0–34.0)
MCV: 101 fL — ABNORMAL HIGH (ref 80–100)
Monocyte #: 0.3 x10 3/mm (ref 0.2–0.9)
Neutrophil %: 22.9 %
Platelet: 173 x10 3/mm (ref 150–440)
RDW: 16.8 % — ABNORMAL HIGH (ref 11.5–14.5)
WBC: 2.8 x10 3/mm — ABNORMAL LOW (ref 3.6–11.0)

## 2012-12-27 LAB — CBC CANCER CENTER
Basophil %: 1.5 %
Eosinophil #: 0 x10 3/mm (ref 0.0–0.7)
Eosinophil %: 1.1 %
HCT: 33 % — ABNORMAL LOW (ref 35.0–47.0)
HGB: 10.6 g/dL — ABNORMAL LOW (ref 12.0–16.0)
Lymphocyte #: 1.5 x10 3/mm (ref 1.0–3.6)
Lymphocyte %: 53 %
MCHC: 32.1 g/dL (ref 32.0–36.0)
Monocyte #: 0.3 x10 3/mm (ref 0.2–0.9)
Neutrophil #: 1 x10 3/mm — ABNORMAL LOW (ref 1.4–6.5)
RBC: 3.25 10*6/uL — ABNORMAL LOW (ref 3.80–5.20)
RDW: 17.2 % — ABNORMAL HIGH (ref 11.5–14.5)

## 2013-01-03 LAB — CBC CANCER CENTER
Basophil #: 0 x10 3/mm (ref 0.0–0.1)
Eosinophil #: 0.1 x10 3/mm (ref 0.0–0.7)
HCT: 33.2 % — ABNORMAL LOW (ref 35.0–47.0)
HGB: 10.7 g/dL — ABNORMAL LOW (ref 12.0–16.0)
Lymphocyte %: 44.8 %
MCH: 32.5 pg (ref 26.0–34.0)
MCHC: 32.1 g/dL (ref 32.0–36.0)
Monocyte #: 0.2 x10 3/mm (ref 0.2–0.9)
Monocyte %: 6.3 %
Neutrophil %: 45.2 %
RBC: 3.28 10*6/uL — ABNORMAL LOW (ref 3.80–5.20)
WBC: 2.9 x10 3/mm — ABNORMAL LOW (ref 3.6–11.0)

## 2013-01-03 LAB — BASIC METABOLIC PANEL
Chloride: 99 mmol/L (ref 98–107)
Co2: 36 mmol/L — ABNORMAL HIGH (ref 21–32)
Creatinine: 1.05 mg/dL (ref 0.60–1.30)
Osmolality: 280 (ref 275–301)
Sodium: 140 mmol/L (ref 136–145)

## 2013-01-03 LAB — HEPATIC FUNCTION PANEL A (ARMC)
Albumin: 3.3 g/dL — ABNORMAL LOW (ref 3.4–5.0)
Alkaline Phosphatase: 73 U/L (ref 50–136)
Bilirubin, Direct: 0.1 mg/dL (ref 0.00–0.20)
SGOT(AST): 23 U/L (ref 15–37)
SGPT (ALT): 29 U/L (ref 12–78)
Total Protein: 8 g/dL (ref 6.4–8.2)

## 2013-01-14 ENCOUNTER — Ambulatory Visit: Payer: Self-pay | Admitting: Internal Medicine

## 2013-01-17 LAB — CBC CANCER CENTER
Basophil #: 0 x10 3/mm (ref 0.0–0.1)
Basophil %: 0.9 %
Eosinophil #: 0.3 x10 3/mm (ref 0.0–0.7)
Eosinophil %: 8.8 %
HCT: 36.2 % (ref 35.0–47.0)
HGB: 11.7 g/dL — ABNORMAL LOW (ref 12.0–16.0)
Lymphocyte #: 1.7 x10 3/mm (ref 1.0–3.6)
Lymphocyte %: 44.8 %
MCV: 101 fL — ABNORMAL HIGH (ref 80–100)
Monocyte #: 0.3 x10 3/mm (ref 0.2–0.9)
Neutrophil #: 1.4 x10 3/mm (ref 1.4–6.5)
RDW: 16.7 % — ABNORMAL HIGH (ref 11.5–14.5)
WBC: 3.7 x10 3/mm (ref 3.6–11.0)

## 2013-01-17 LAB — POTASSIUM: Potassium: 3.5 mmol/L (ref 3.5–5.1)

## 2013-01-31 LAB — CBC CANCER CENTER
Basophil #: 0 x10 3/mm (ref 0.0–0.1)
Basophil %: 0.8 %
Eosinophil #: 0.1 x10 3/mm (ref 0.0–0.7)
Eosinophil %: 3.3 %
HCT: 33.1 % — ABNORMAL LOW (ref 35.0–47.0)
HGB: 10.8 g/dL — ABNORMAL LOW (ref 12.0–16.0)
Lymphocyte #: 1.5 x10 3/mm (ref 1.0–3.6)
Lymphocyte %: 44.8 %
MCH: 32.9 pg (ref 26.0–34.0)
MCHC: 32.7 g/dL (ref 32.0–36.0)
MCV: 101 fL — ABNORMAL HIGH (ref 80–100)
Monocyte #: 0.3 x10 3/mm (ref 0.2–0.9)
Monocyte %: 8.6 %
Neutrophil #: 1.4 x10 3/mm (ref 1.4–6.5)
Neutrophil %: 42.5 %
Platelet: 167 x10 3/mm (ref 150–440)
RBC: 3.28 10*6/uL — ABNORMAL LOW (ref 3.80–5.20)
RDW: 17.1 % — ABNORMAL HIGH (ref 11.5–14.5)
WBC: 3.3 x10 3/mm — ABNORMAL LOW (ref 3.6–11.0)

## 2013-01-31 LAB — PHOSPHORUS: Phosphorus: 4 mg/dL (ref 2.5–4.9)

## 2013-01-31 LAB — CREATININE, SERUM: EGFR (African American): 50 — ABNORMAL LOW

## 2013-01-31 LAB — MAGNESIUM: Magnesium: 2 mg/dL

## 2013-01-31 LAB — POTASSIUM: Potassium: 3.9 mmol/L (ref 3.5–5.1)

## 2013-02-13 LAB — CBC CANCER CENTER
Basophil #: 0 x10 3/mm (ref 0.0–0.1)
Basophil %: 0.8 %
HCT: 33.3 % — ABNORMAL LOW (ref 35.0–47.0)
HGB: 10.7 g/dL — ABNORMAL LOW (ref 12.0–16.0)
Lymphocyte %: 45.8 %
MCH: 32.5 pg (ref 26.0–34.0)
MCHC: 32.2 g/dL (ref 32.0–36.0)
MCV: 101 fL — ABNORMAL HIGH (ref 80–100)
Monocyte %: 10.9 %
Platelet: 155 x10 3/mm (ref 150–440)
RBC: 3.3 10*6/uL — ABNORMAL LOW (ref 3.80–5.20)
WBC: 3 x10 3/mm — ABNORMAL LOW (ref 3.6–11.0)

## 2013-02-14 ENCOUNTER — Ambulatory Visit: Payer: Self-pay | Admitting: Internal Medicine

## 2013-02-15 LAB — KAPPA/LAMBDA FREE LIGHT CHAINS (ARMC)

## 2013-02-15 LAB — PROT IMMUNOELECTROPHORES(ARMC)

## 2013-02-28 LAB — BASIC METABOLIC PANEL
Anion Gap: 7 (ref 7–16)
BUN: 18 mg/dL (ref 7–18)
CALCIUM: 8.9 mg/dL (ref 8.5–10.1)
CO2: 31 mmol/L (ref 21–32)
Chloride: 97 mmol/L — ABNORMAL LOW (ref 98–107)
Creatinine: 1.31 mg/dL — ABNORMAL HIGH (ref 0.60–1.30)
EGFR (African American): 48 — ABNORMAL LOW
EGFR (Non-African Amer.): 41 — ABNORMAL LOW
GLUCOSE: 154 mg/dL — AB (ref 65–99)
OSMOLALITY: 275 (ref 275–301)
Potassium: 3.5 mmol/L (ref 3.5–5.1)
Sodium: 135 mmol/L — ABNORMAL LOW (ref 136–145)

## 2013-02-28 LAB — CBC CANCER CENTER
BASOS ABS: 0 x10 3/mm (ref 0.0–0.1)
BASOS PCT: 1.3 %
EOS ABS: 0 x10 3/mm (ref 0.0–0.7)
Eosinophil %: 0.8 %
HCT: 32.4 % — ABNORMAL LOW (ref 35.0–47.0)
HGB: 10.4 g/dL — ABNORMAL LOW (ref 12.0–16.0)
LYMPHS ABS: 1.2 x10 3/mm (ref 1.0–3.6)
Lymphocyte %: 46.2 %
MCH: 32.4 pg (ref 26.0–34.0)
MCHC: 32.1 g/dL (ref 32.0–36.0)
MCV: 101 fL — ABNORMAL HIGH (ref 80–100)
MONOS PCT: 11 %
Monocyte #: 0.3 x10 3/mm (ref 0.2–0.9)
NEUTROS PCT: 40.7 %
Neutrophil #: 1.1 x10 3/mm — ABNORMAL LOW (ref 1.4–6.5)
Platelet: 161 x10 3/mm (ref 150–440)
RBC: 3.2 10*6/uL — ABNORMAL LOW (ref 3.80–5.20)
RDW: 17.9 % — AB (ref 11.5–14.5)
WBC: 2.6 x10 3/mm — AB (ref 3.6–11.0)

## 2013-02-28 LAB — HEPATIC FUNCTION PANEL A (ARMC)
ALK PHOS: 76 U/L
ALT: 29 U/L (ref 12–78)
AST: 40 U/L — AB (ref 15–37)
Albumin: 3.2 g/dL — ABNORMAL LOW (ref 3.4–5.0)
BILIRUBIN DIRECT: 0.1 mg/dL (ref 0.00–0.20)
Bilirubin,Total: 0.3 mg/dL (ref 0.2–1.0)
TOTAL PROTEIN: 9.9 g/dL — AB (ref 6.4–8.2)

## 2013-02-28 LAB — MAGNESIUM: Magnesium: 2 mg/dL

## 2013-02-28 LAB — PHOSPHORUS: Phosphorus: 4.1 mg/dL (ref 2.5–4.9)

## 2013-03-17 ENCOUNTER — Ambulatory Visit: Payer: Self-pay | Admitting: Internal Medicine

## 2013-03-28 ENCOUNTER — Ambulatory Visit: Payer: Self-pay | Admitting: Family Medicine

## 2013-03-29 ENCOUNTER — Inpatient Hospital Stay: Payer: Self-pay | Admitting: Internal Medicine

## 2013-03-29 ENCOUNTER — Ambulatory Visit: Payer: Self-pay | Admitting: Family Medicine

## 2013-03-29 LAB — URINALYSIS, COMPLETE
BILIRUBIN, UR: NEGATIVE
Bacteria: NONE SEEN
Glucose,UR: NEGATIVE mg/dL (ref 0–75)
KETONE: NEGATIVE
Leukocyte Esterase: NEGATIVE
NITRITE: NEGATIVE
Ph: 6 (ref 4.5–8.0)
RBC,UR: 2 /HPF (ref 0–5)
SPECIFIC GRAVITY: 1.016 (ref 1.003–1.030)

## 2013-03-29 LAB — CBC CANCER CENTER
BASOS ABS: 0 x10 3/mm (ref 0.0–0.1)
BASOS PCT: 0.5 %
EOS PCT: 0.7 %
Eosinophil #: 0 x10 3/mm (ref 0.0–0.7)
HCT: 25.8 % — AB (ref 35.0–47.0)
HGB: 8.4 g/dL — ABNORMAL LOW (ref 12.0–16.0)
Lymphocyte #: 1.5 x10 3/mm (ref 1.0–3.6)
Lymphocyte %: 28.9 %
MCH: 32.1 pg (ref 26.0–34.0)
MCHC: 32.7 g/dL (ref 32.0–36.0)
MCV: 98 fL (ref 80–100)
Monocyte #: 0.6 x10 3/mm (ref 0.2–0.9)
Monocyte %: 11.7 %
Neutrophil #: 3.1 x10 3/mm (ref 1.4–6.5)
Neutrophil %: 58.2 %
Platelet: 184 x10 3/mm (ref 150–440)
RBC: 2.63 10*6/uL — ABNORMAL LOW (ref 3.80–5.20)
RDW: 18 % — ABNORMAL HIGH (ref 11.5–14.5)
WBC: 5.3 x10 3/mm (ref 3.6–11.0)

## 2013-03-29 LAB — BASIC METABOLIC PANEL
Anion Gap: 5 — ABNORMAL LOW (ref 7–16)
BUN: 52 mg/dL — AB (ref 7–18)
CALCIUM: 8.8 mg/dL (ref 8.5–10.1)
CREATININE: 8.86 mg/dL — AB (ref 0.60–1.30)
Chloride: 97 mmol/L — ABNORMAL LOW (ref 98–107)
Co2: 26 mmol/L (ref 21–32)
EGFR (African American): 5 — ABNORMAL LOW
EGFR (Non-African Amer.): 4 — ABNORMAL LOW
Glucose: 100 mg/dL — ABNORMAL HIGH (ref 65–99)
OSMOLALITY: 271 (ref 275–301)
Potassium: 5.3 mmol/L — ABNORMAL HIGH (ref 3.5–5.1)
Sodium: 128 mmol/L — ABNORMAL LOW (ref 136–145)

## 2013-03-29 LAB — PROTIME-INR
INR: 1.2
Prothrombin Time: 14.8 secs — ABNORMAL HIGH (ref 11.5–14.7)

## 2013-03-29 LAB — CREATININE, SERUM
CREATININE: 8.84 mg/dL — AB (ref 0.60–1.30)
EGFR (Non-African Amer.): 4 — ABNORMAL LOW
GFR CALC AF AMER: 5 — AB

## 2013-03-29 LAB — HEPATIC FUNCTION PANEL A (ARMC)
AST: 59 U/L — AB (ref 15–37)
Albumin: 2.8 g/dL — ABNORMAL LOW (ref 3.4–5.0)
Alkaline Phosphatase: 87 U/L
BILIRUBIN DIRECT: 0.1 mg/dL (ref 0.00–0.20)
BILIRUBIN TOTAL: 0.2 mg/dL (ref 0.2–1.0)
SGPT (ALT): 33 U/L (ref 12–78)
TOTAL PROTEIN: 12.8 g/dL — AB (ref 6.4–8.2)

## 2013-03-29 LAB — CALCIUM: Calcium, Total: 9.9 mg/dL (ref 8.5–10.1)

## 2013-03-29 LAB — APTT: ACTIVATED PTT: 32.6 s (ref 23.6–35.9)

## 2013-03-30 LAB — BASIC METABOLIC PANEL
ANION GAP: 4 — AB (ref 7–16)
BUN: 48 mg/dL — AB (ref 7–18)
CHLORIDE: 105 mmol/L (ref 98–107)
CREATININE: 8.1 mg/dL — AB (ref 0.60–1.30)
Calcium, Total: 8.3 mg/dL — ABNORMAL LOW (ref 8.5–10.1)
Co2: 22 mmol/L (ref 21–32)
EGFR (African American): 5 — ABNORMAL LOW
EGFR (Non-African Amer.): 5 — ABNORMAL LOW
Glucose: 94 mg/dL (ref 65–99)
OSMOLALITY: 275 (ref 275–301)
Potassium: 4.9 mmol/L (ref 3.5–5.1)
SODIUM: 131 mmol/L — AB (ref 136–145)

## 2013-03-30 LAB — URIC ACID: Uric Acid: 8.6 mg/dL — ABNORMAL HIGH (ref 2.6–6.0)

## 2013-03-31 LAB — CBC WITH DIFFERENTIAL/PLATELET
BASOS ABS: 0 10*3/uL (ref 0.0–0.1)
Basophil %: 0.2 %
EOS PCT: 0.1 %
Eosinophil #: 0 10*3/uL (ref 0.0–0.7)
HCT: 20.3 % — ABNORMAL LOW (ref 35.0–47.0)
HGB: 6.7 g/dL — AB (ref 12.0–16.0)
LYMPHS ABS: 0.7 10*3/uL — AB (ref 1.0–3.6)
Lymphocyte %: 24.1 %
MCH: 32.9 pg (ref 26.0–34.0)
MCHC: 32.8 g/dL (ref 32.0–36.0)
MCV: 100 fL (ref 80–100)
MONO ABS: 0.2 x10 3/mm (ref 0.2–0.9)
Monocyte %: 7.4 %
Neutrophil #: 1.9 10*3/uL (ref 1.4–6.5)
Neutrophil %: 68.2 %
Platelet: 138 10*3/uL — ABNORMAL LOW (ref 150–440)
RBC: 2.02 10*6/uL — ABNORMAL LOW (ref 3.80–5.20)
RDW: 18.4 % — AB (ref 11.5–14.5)
WBC: 2.8 10*3/uL — ABNORMAL LOW (ref 3.6–11.0)

## 2013-03-31 LAB — BASIC METABOLIC PANEL
Anion Gap: 5 — ABNORMAL LOW (ref 7–16)
BUN: 39 mg/dL — ABNORMAL HIGH (ref 7–18)
CALCIUM: 7.2 mg/dL — AB (ref 8.5–10.1)
Chloride: 110 mmol/L — ABNORMAL HIGH (ref 98–107)
Co2: 22 mmol/L (ref 21–32)
Creatinine: 5.63 mg/dL — ABNORMAL HIGH (ref 0.60–1.30)
EGFR (Non-African Amer.): 7 — ABNORMAL LOW
GFR CALC AF AMER: 8 — AB
Glucose: 136 mg/dL — ABNORMAL HIGH (ref 65–99)
Osmolality: 285 (ref 275–301)
POTASSIUM: 4.9 mmol/L (ref 3.5–5.1)
Sodium: 137 mmol/L (ref 136–145)

## 2013-04-01 LAB — CBC WITH DIFFERENTIAL/PLATELET
Basophil #: 0 10*3/uL (ref 0.0–0.1)
Basophil %: 0.1 %
Eosinophil #: 0 10*3/uL (ref 0.0–0.7)
Eosinophil %: 0 %
HCT: 22 % — AB (ref 35.0–47.0)
HGB: 7.3 g/dL — ABNORMAL LOW (ref 12.0–16.0)
LYMPHS ABS: 0.6 10*3/uL — AB (ref 1.0–3.6)
Lymphocyte %: 15.4 %
MCH: 32.3 pg (ref 26.0–34.0)
MCHC: 33.1 g/dL (ref 32.0–36.0)
MCV: 97 fL (ref 80–100)
MONOS PCT: 8.3 %
Monocyte #: 0.3 x10 3/mm (ref 0.2–0.9)
NEUTROS ABS: 3.2 10*3/uL (ref 1.4–6.5)
Neutrophil %: 76.2 %
PLATELETS: 121 10*3/uL — AB (ref 150–440)
RBC: 2.26 10*6/uL — AB (ref 3.80–5.20)
RDW: 20.1 % — ABNORMAL HIGH (ref 11.5–14.5)
WBC: 4.2 10*3/uL (ref 3.6–11.0)

## 2013-04-01 LAB — URINALYSIS, COMPLETE
BACTERIA: NONE SEEN
BILIRUBIN, UR: NEGATIVE
GLUCOSE, UR: NEGATIVE mg/dL (ref 0–75)
KETONE: NEGATIVE
LEUKOCYTE ESTERASE: NEGATIVE
Nitrite: NEGATIVE
PH: 5 (ref 4.5–8.0)
Protein: 30
RBC,UR: 6 /HPF (ref 0–5)
Specific Gravity: 1.014 (ref 1.003–1.030)
Squamous Epithelial: <1

## 2013-04-01 LAB — CANCER ANTIGEN 19-9: CA 19-9: 8 U/mL (ref 0–35)

## 2013-04-01 LAB — CA 125: CA 125: 9.8 U/mL (ref 0.0–34.0)

## 2013-04-01 LAB — BASIC METABOLIC PANEL
ANION GAP: 4 — AB (ref 7–16)
BUN: 36 mg/dL — AB (ref 7–18)
CO2: 21 mmol/L (ref 21–32)
Calcium, Total: 6.8 mg/dL — CL (ref 8.5–10.1)
Chloride: 114 mmol/L — ABNORMAL HIGH (ref 98–107)
Creatinine: 3.97 mg/dL — ABNORMAL HIGH (ref 0.60–1.30)
EGFR (Non-African Amer.): 11 — ABNORMAL LOW
GFR CALC AF AMER: 12 — AB
GLUCOSE: 135 mg/dL — AB (ref 65–99)
Osmolality: 288 (ref 275–301)
POTASSIUM: 5.2 mmol/L — AB (ref 3.5–5.1)
SODIUM: 139 mmol/L (ref 136–145)

## 2013-04-01 LAB — PROTEIN / CREATININE RATIO, URINE
Creatinine, Urine: 90.8 mg/dL (ref 30.0–125.0)
PROTEIN/CREAT. RATIO: 1134 mg/g{creat} — AB (ref 0–200)
Protein, Random Urine: 103 mg/dL — ABNORMAL HIGH (ref 0–12)

## 2013-04-01 LAB — CANCER ANTIGEN 27.29: CA 27.29: 61.3 U/mL — AB (ref 0.0–38.6)

## 2013-04-01 LAB — CEA: CEA: 4 ng/mL (ref 0.0–4.7)

## 2013-04-02 LAB — BASIC METABOLIC PANEL
Anion Gap: 5 — ABNORMAL LOW (ref 7–16)
BUN: 31 mg/dL — ABNORMAL HIGH (ref 7–18)
CO2: 19 mmol/L — AB (ref 21–32)
Calcium, Total: 7.1 mg/dL — ABNORMAL LOW (ref 8.5–10.1)
Chloride: 117 mmol/L — ABNORMAL HIGH (ref 98–107)
Creatinine: 2.91 mg/dL — ABNORMAL HIGH (ref 0.60–1.30)
EGFR (African American): 18 — ABNORMAL LOW
GFR CALC NON AF AMER: 16 — AB
Glucose: 95 mg/dL (ref 65–99)
OSMOLALITY: 288 (ref 275–301)
Potassium: 4.8 mmol/L (ref 3.5–5.1)
Sodium: 141 mmol/L (ref 136–145)

## 2013-04-02 LAB — HEMOGLOBIN: HGB: 7.4 g/dL — AB (ref 12.0–16.0)

## 2013-04-03 LAB — BASIC METABOLIC PANEL
ANION GAP: 5 — AB (ref 7–16)
BUN: 27 mg/dL — ABNORMAL HIGH (ref 7–18)
CALCIUM: 6.7 mg/dL — AB (ref 8.5–10.1)
Chloride: 115 mmol/L — ABNORMAL HIGH (ref 98–107)
Co2: 21 mmol/L (ref 21–32)
Creatinine: 2.3 mg/dL — ABNORMAL HIGH (ref 0.60–1.30)
EGFR (African American): 24 — ABNORMAL LOW
EGFR (Non-African Amer.): 21 — ABNORMAL LOW
Glucose: 101 mg/dL — ABNORMAL HIGH (ref 65–99)
OSMOLALITY: 287 (ref 275–301)
POTASSIUM: 4.5 mmol/L (ref 3.5–5.1)
SODIUM: 141 mmol/L (ref 136–145)

## 2013-04-03 LAB — CBC WITH DIFFERENTIAL/PLATELET
BASOS PCT: 0.1 %
Basophil #: 0 10*3/uL (ref 0.0–0.1)
EOS PCT: 0 %
Eosinophil #: 0 10*3/uL (ref 0.0–0.7)
HCT: 23.4 % — AB (ref 35.0–47.0)
HGB: 7.7 g/dL — ABNORMAL LOW (ref 12.0–16.0)
LYMPHS PCT: 10.4 %
Lymphocyte #: 0.7 10*3/uL — ABNORMAL LOW (ref 1.0–3.6)
MCH: 32.4 pg (ref 26.0–34.0)
MCHC: 33 g/dL (ref 32.0–36.0)
MCV: 98 fL (ref 80–100)
Monocyte #: 0.5 x10 3/mm (ref 0.2–0.9)
Monocyte %: 7.6 %
NEUTROS ABS: 5.4 10*3/uL (ref 1.4–6.5)
Neutrophil %: 81.9 %
PLATELETS: 123 10*3/uL — AB (ref 150–440)
RBC: 2.39 10*6/uL — ABNORMAL LOW (ref 3.80–5.20)
RDW: 19.9 % — AB (ref 11.5–14.5)
WBC: 6.6 10*3/uL (ref 3.6–11.0)

## 2013-04-04 DIAGNOSIS — I369 Nonrheumatic tricuspid valve disorder, unspecified: Secondary | ICD-10-CM

## 2013-04-04 LAB — BASIC METABOLIC PANEL
ANION GAP: 4 — AB (ref 7–16)
BUN: 23 mg/dL — ABNORMAL HIGH (ref 7–18)
Calcium, Total: 8.1 mg/dL — ABNORMAL LOW (ref 8.5–10.1)
Chloride: 110 mmol/L — ABNORMAL HIGH (ref 98–107)
Co2: 25 mmol/L (ref 21–32)
Creatinine: 1.98 mg/dL — ABNORMAL HIGH (ref 0.60–1.30)
EGFR (African American): 29 — ABNORMAL LOW
GFR CALC NON AF AMER: 25 — AB
Glucose: 91 mg/dL (ref 65–99)
Osmolality: 281 (ref 275–301)
Potassium: 3.9 mmol/L (ref 3.5–5.1)
Sodium: 139 mmol/L (ref 136–145)

## 2013-04-05 LAB — RENAL FUNCTION PANEL
Albumin: 2 g/dL — ABNORMAL LOW (ref 3.4–5.0)
Anion Gap: 2 — ABNORMAL LOW (ref 7–16)
BUN: 19 mg/dL — ABNORMAL HIGH (ref 7–18)
CO2: 29 mmol/L (ref 21–32)
Calcium, Total: 8 mg/dL — ABNORMAL LOW (ref 8.5–10.1)
Chloride: 105 mmol/L (ref 98–107)
Creatinine: 1.61 mg/dL — ABNORMAL HIGH (ref 0.60–1.30)
EGFR (African American): 37 — ABNORMAL LOW
EGFR (Non-African Amer.): 32 — ABNORMAL LOW
Glucose: 83 mg/dL (ref 65–99)
Osmolality: 273 (ref 275–301)
Phosphorus: 2.2 mg/dL — ABNORMAL LOW (ref 2.5–4.9)
Potassium: 3.9 mmol/L (ref 3.5–5.1)
Sodium: 136 mmol/L (ref 136–145)

## 2013-04-05 LAB — HEMOGLOBIN: HGB: 7.2 g/dL — ABNORMAL LOW (ref 12.0–16.0)

## 2013-04-06 LAB — BASIC METABOLIC PANEL
Anion Gap: 2 — ABNORMAL LOW (ref 7–16)
BUN: 20 mg/dL — ABNORMAL HIGH (ref 7–18)
CALCIUM: 8.6 mg/dL (ref 8.5–10.1)
Chloride: 98 mmol/L (ref 98–107)
Co2: 35 mmol/L — ABNORMAL HIGH (ref 21–32)
Creatinine: 1.7 mg/dL — ABNORMAL HIGH (ref 0.60–1.30)
EGFR (Non-African Amer.): 30 — ABNORMAL LOW
GFR CALC AF AMER: 35 — AB
Glucose: 87 mg/dL (ref 65–99)
OSMOLALITY: 272 (ref 275–301)
POTASSIUM: 3.5 mmol/L (ref 3.5–5.1)
Sodium: 135 mmol/L — ABNORMAL LOW (ref 136–145)

## 2013-04-06 LAB — HEMOGLOBIN: HGB: 8.2 g/dL — ABNORMAL LOW (ref 12.0–16.0)

## 2013-04-07 LAB — BASIC METABOLIC PANEL
Anion Gap: 0 — ABNORMAL LOW (ref 7–16)
BUN: 23 mg/dL — ABNORMAL HIGH (ref 7–18)
Calcium, Total: 8.8 mg/dL (ref 8.5–10.1)
Chloride: 93 mmol/L — ABNORMAL LOW (ref 98–107)
Co2: 40 mmol/L (ref 21–32)
Creatinine: 1.7 mg/dL — ABNORMAL HIGH (ref 0.60–1.30)
EGFR (Non-African Amer.): 30 — ABNORMAL LOW
GFR CALC AF AMER: 35 — AB
Glucose: 90 mg/dL (ref 65–99)
Osmolality: 270 (ref 275–301)
Potassium: 3.3 mmol/L — ABNORMAL LOW (ref 3.5–5.1)
SODIUM: 133 mmol/L — AB (ref 136–145)

## 2013-04-07 LAB — PLATELET COUNT: Platelet: 174 10*3/uL (ref 150–440)

## 2013-04-08 LAB — BASIC METABOLIC PANEL
ANION GAP: 0 — AB (ref 7–16)
BUN: 24 mg/dL — ABNORMAL HIGH (ref 7–18)
Calcium, Total: 9.1 mg/dL (ref 8.5–10.1)
Chloride: 94 mmol/L — ABNORMAL LOW (ref 98–107)
Co2: 40 mmol/L (ref 21–32)
Creatinine: 1.7 mg/dL — ABNORMAL HIGH (ref 0.60–1.30)
EGFR (African American): 35 — ABNORMAL LOW
GFR CALC NON AF AMER: 30 — AB
GLUCOSE: 97 mg/dL (ref 65–99)
Osmolality: 272 (ref 275–301)
Potassium: 3.7 mmol/L (ref 3.5–5.1)
SODIUM: 134 mmol/L — AB (ref 136–145)

## 2013-04-08 LAB — PROTIME-INR
INR: 1.1
Prothrombin Time: 13.7 secs (ref 11.5–14.7)

## 2013-04-08 LAB — PLATELET COUNT: PLATELETS: 160 10*3/uL (ref 150–440)

## 2013-04-09 LAB — BASIC METABOLIC PANEL
ANION GAP: 3 — AB (ref 7–16)
BUN: 23 mg/dL — AB (ref 7–18)
CALCIUM: 8.8 mg/dL (ref 8.5–10.1)
Chloride: 95 mmol/L — ABNORMAL LOW (ref 98–107)
Co2: 36 mmol/L — ABNORMAL HIGH (ref 21–32)
Creatinine: 1.38 mg/dL — ABNORMAL HIGH (ref 0.60–1.30)
EGFR (African American): 45 — ABNORMAL LOW
GFR CALC NON AF AMER: 39 — AB
Glucose: 107 mg/dL — ABNORMAL HIGH (ref 65–99)
OSMOLALITY: 272 (ref 275–301)
Potassium: 4.2 mmol/L (ref 3.5–5.1)
SODIUM: 134 mmol/L — AB (ref 136–145)

## 2013-04-10 LAB — BASIC METABOLIC PANEL
Anion Gap: 2 — ABNORMAL LOW (ref 7–16)
BUN: 25 mg/dL — ABNORMAL HIGH (ref 7–18)
CALCIUM: 8.5 mg/dL (ref 8.5–10.1)
CREATININE: 1.42 mg/dL — AB (ref 0.60–1.30)
Chloride: 99 mmol/L (ref 98–107)
Co2: 33 mmol/L — ABNORMAL HIGH (ref 21–32)
EGFR (African American): 43 — ABNORMAL LOW
EGFR (Non-African Amer.): 37 — ABNORMAL LOW
Glucose: 98 mg/dL (ref 65–99)
Osmolality: 273 (ref 275–301)
Potassium: 3.6 mmol/L (ref 3.5–5.1)
SODIUM: 134 mmol/L — AB (ref 136–145)

## 2013-04-11 LAB — KAPPA/LAMBDA FREE LIGHT CHAINS (ARMC)

## 2013-04-11 LAB — PROTEIN ELECTROPHORESIS(ARMC)

## 2013-04-14 ENCOUNTER — Ambulatory Visit: Payer: Self-pay | Admitting: Internal Medicine

## 2013-04-22 LAB — PATHOLOGY REPORT

## 2013-04-25 ENCOUNTER — Ambulatory Visit: Payer: Self-pay | Admitting: Internal Medicine

## 2013-04-25 LAB — COMPREHENSIVE METABOLIC PANEL
ALBUMIN: 2.8 g/dL — AB (ref 3.4–5.0)
ALK PHOS: 141 U/L — AB
Anion Gap: 5 — ABNORMAL LOW (ref 7–16)
BILIRUBIN TOTAL: 0.4 mg/dL (ref 0.2–1.0)
BUN: 57 mg/dL — ABNORMAL HIGH (ref 7–18)
CALCIUM: 11.2 mg/dL — AB (ref 8.5–10.1)
Chloride: 101 mmol/L (ref 98–107)
Co2: 36 mmol/L — ABNORMAL HIGH (ref 21–32)
Creatinine: 2.58 mg/dL — ABNORMAL HIGH (ref 0.60–1.30)
EGFR (African American): 21 — ABNORMAL LOW
GFR CALC NON AF AMER: 18 — AB
Glucose: 147 mg/dL — ABNORMAL HIGH (ref 65–99)
Osmolality: 302 (ref 275–301)
Potassium: 3.7 mmol/L (ref 3.5–5.1)
SGOT(AST): 43 U/L — ABNORMAL HIGH (ref 15–37)
SGPT (ALT): 53 U/L (ref 12–78)
SODIUM: 142 mmol/L (ref 136–145)
Total Protein: 10.6 g/dL — ABNORMAL HIGH (ref 6.4–8.2)

## 2013-04-25 LAB — CBC CANCER CENTER
BASOS ABS: 0 x10 3/mm (ref 0.0–0.1)
Basophil %: 0.8 %
EOS ABS: 0.1 x10 3/mm (ref 0.0–0.7)
EOS PCT: 3.3 %
HCT: 26.9 % — ABNORMAL LOW (ref 35.0–47.0)
HGB: 8.6 g/dL — AB (ref 12.0–16.0)
LYMPHS ABS: 1.8 x10 3/mm (ref 1.0–3.6)
Lymphocyte %: 47.3 %
MCH: 31.5 pg (ref 26.0–34.0)
MCHC: 32 g/dL (ref 32.0–36.0)
MCV: 98 fL (ref 80–100)
Monocyte #: 0.4 x10 3/mm (ref 0.2–0.9)
Monocyte %: 10.5 %
NEUTROS PCT: 38.1 %
Neutrophil #: 1.4 x10 3/mm (ref 1.4–6.5)
Platelet: 170 x10 3/mm (ref 150–440)
RBC: 2.74 10*6/uL — ABNORMAL LOW (ref 3.80–5.20)
RDW: 18.6 % — ABNORMAL HIGH (ref 11.5–14.5)
WBC: 3.8 x10 3/mm (ref 3.6–11.0)

## 2013-04-25 LAB — BILIRUBIN, DIRECT: Bilirubin, Direct: 0.1 mg/dL (ref 0.00–0.20)

## 2013-04-26 LAB — BASIC METABOLIC PANEL
Anion Gap: 6 — ABNORMAL LOW (ref 7–16)
BUN: 51 mg/dL — ABNORMAL HIGH (ref 7–18)
CHLORIDE: 99 mmol/L (ref 98–107)
Calcium, Total: 11.1 mg/dL — ABNORMAL HIGH (ref 8.5–10.1)
Co2: 36 mmol/L — ABNORMAL HIGH (ref 21–32)
Creatinine: 2.38 mg/dL — ABNORMAL HIGH (ref 0.60–1.30)
EGFR (African American): 23 — ABNORMAL LOW
EGFR (Non-African Amer.): 20 — ABNORMAL LOW
Glucose: 131 mg/dL — ABNORMAL HIGH (ref 65–99)
OSMOLALITY: 297 (ref 275–301)
Potassium: 3.9 mmol/L (ref 3.5–5.1)
Sodium: 141 mmol/L (ref 136–145)

## 2013-05-02 LAB — BASIC METABOLIC PANEL
Anion Gap: 3 — ABNORMAL LOW (ref 7–16)
BUN: 20 mg/dL — ABNORMAL HIGH (ref 7–18)
CHLORIDE: 101 mmol/L (ref 98–107)
CO2: 34 mmol/L — AB (ref 21–32)
Calcium, Total: 8.5 mg/dL (ref 8.5–10.1)
Creatinine: 1.44 mg/dL — ABNORMAL HIGH (ref 0.60–1.30)
GFR CALC AF AMER: 43 — AB
GFR CALC NON AF AMER: 37 — AB
Glucose: 101 mg/dL — ABNORMAL HIGH (ref 65–99)
OSMOLALITY: 278 (ref 275–301)
Potassium: 3.2 mmol/L — ABNORMAL LOW (ref 3.5–5.1)
Sodium: 138 mmol/L (ref 136–145)

## 2013-05-02 LAB — CBC CANCER CENTER
Basophil #: 0 x10 3/mm (ref 0.0–0.1)
Basophil %: 0.7 %
EOS PCT: 4 %
Eosinophil #: 0.1 x10 3/mm (ref 0.0–0.7)
HCT: 24.2 % — AB (ref 35.0–47.0)
HGB: 7.9 g/dL — ABNORMAL LOW (ref 12.0–16.0)
LYMPHS ABS: 1.6 x10 3/mm (ref 1.0–3.6)
Lymphocyte %: 49.3 %
MCH: 31.7 pg (ref 26.0–34.0)
MCHC: 32.6 g/dL (ref 32.0–36.0)
MCV: 97 fL (ref 80–100)
Monocyte #: 0.4 x10 3/mm (ref 0.2–0.9)
Monocyte %: 11.5 %
Neutrophil #: 1.2 x10 3/mm — ABNORMAL LOW (ref 1.4–6.5)
Neutrophil %: 34.5 %
Platelet: 193 x10 3/mm (ref 150–440)
RBC: 2.49 10*6/uL — ABNORMAL LOW (ref 3.80–5.20)
RDW: 18.4 % — ABNORMAL HIGH (ref 11.5–14.5)
WBC: 3.3 x10 3/mm — ABNORMAL LOW (ref 3.6–11.0)

## 2013-05-09 LAB — BASIC METABOLIC PANEL
ANION GAP: 7 (ref 7–16)
BUN: 15 mg/dL (ref 7–18)
CALCIUM: 8.3 mg/dL — AB (ref 8.5–10.1)
CHLORIDE: 101 mmol/L (ref 98–107)
Co2: 31 mmol/L (ref 21–32)
Creatinine: 1.22 mg/dL (ref 0.60–1.30)
EGFR (African American): 52 — ABNORMAL LOW
EGFR (Non-African Amer.): 45 — ABNORMAL LOW
Glucose: 142 mg/dL — ABNORMAL HIGH (ref 65–99)
OSMOLALITY: 281 (ref 275–301)
Potassium: 4.1 mmol/L (ref 3.5–5.1)
Sodium: 139 mmol/L (ref 136–145)

## 2013-05-09 LAB — CBC CANCER CENTER
BASOS ABS: 0.1 x10 3/mm (ref 0.0–0.1)
BASOS PCT: 2.3 %
EOS PCT: 1.1 %
Eosinophil #: 0.1 x10 3/mm (ref 0.0–0.7)
HCT: 25 % — ABNORMAL LOW (ref 35.0–47.0)
HGB: 8.1 g/dL — AB (ref 12.0–16.0)
LYMPHS ABS: 2.3 x10 3/mm (ref 1.0–3.6)
Lymphocyte %: 42.7 %
MCH: 32.3 pg (ref 26.0–34.0)
MCHC: 32.3 g/dL (ref 32.0–36.0)
MCV: 100 fL (ref 80–100)
MONOS PCT: 13.9 %
Monocyte #: 0.7 x10 3/mm (ref 0.2–0.9)
NEUTROS PCT: 40 %
Neutrophil #: 2.1 x10 3/mm (ref 1.4–6.5)
Platelet: 282 x10 3/mm (ref 150–440)
RBC: 2.49 10*6/uL — ABNORMAL LOW (ref 3.80–5.20)
RDW: 19.4 % — ABNORMAL HIGH (ref 11.5–14.5)
WBC: 5.3 x10 3/mm (ref 3.6–11.0)

## 2013-05-09 LAB — HEPATIC FUNCTION PANEL A (ARMC)
ALBUMIN: 2.9 g/dL — AB (ref 3.4–5.0)
ALK PHOS: 261 U/L — AB
AST: 31 U/L (ref 15–37)
BILIRUBIN TOTAL: 0.3 mg/dL (ref 0.2–1.0)
Bilirubin, Direct: 0.1 mg/dL (ref 0.00–0.20)
SGPT (ALT): 68 U/L (ref 12–78)
TOTAL PROTEIN: 8.5 g/dL — AB (ref 6.4–8.2)

## 2013-05-09 LAB — PHOSPHORUS: PHOSPHORUS: 2.8 mg/dL (ref 2.5–4.9)

## 2013-05-09 LAB — MAGNESIUM: Magnesium: 2.3 mg/dL

## 2013-05-10 LAB — HM MAMMOGRAPHY: HM Mammogram: ABNORMAL

## 2013-05-12 ENCOUNTER — Emergency Department: Payer: Self-pay | Admitting: Emergency Medicine

## 2013-05-12 LAB — CBC
HCT: 23.7 % — AB (ref 35.0–47.0)
HGB: 8 g/dL — AB (ref 12.0–16.0)
MCH: 33.2 pg (ref 26.0–34.0)
MCHC: 33.5 g/dL (ref 32.0–36.0)
MCV: 99 fL (ref 80–100)
PLATELETS: 283 10*3/uL (ref 150–440)
RBC: 2.4 10*6/uL — ABNORMAL LOW (ref 3.80–5.20)
RDW: 20.6 % — AB (ref 11.5–14.5)
WBC: 4.5 10*3/uL (ref 3.6–11.0)

## 2013-05-12 LAB — COMPREHENSIVE METABOLIC PANEL
ALT: 38 U/L (ref 12–78)
ANION GAP: 3 — AB (ref 7–16)
Albumin: 3 g/dL — ABNORMAL LOW (ref 3.4–5.0)
Alkaline Phosphatase: 287 U/L — ABNORMAL HIGH
BUN: 12 mg/dL (ref 7–18)
Bilirubin,Total: 0.5 mg/dL (ref 0.2–1.0)
CHLORIDE: 99 mmol/L (ref 98–107)
CO2: 30 mmol/L (ref 21–32)
Calcium, Total: 8.8 mg/dL (ref 8.5–10.1)
Creatinine: 1.31 mg/dL — ABNORMAL HIGH (ref 0.60–1.30)
EGFR (Non-African Amer.): 41 — ABNORMAL LOW
GFR CALC AF AMER: 48 — AB
Glucose: 132 mg/dL — ABNORMAL HIGH (ref 65–99)
Osmolality: 266 (ref 275–301)
Potassium: 3.8 mmol/L (ref 3.5–5.1)
SGOT(AST): 33 U/L (ref 15–37)
Sodium: 132 mmol/L — ABNORMAL LOW (ref 136–145)
Total Protein: 8.6 g/dL — ABNORMAL HIGH (ref 6.4–8.2)

## 2013-05-15 ENCOUNTER — Ambulatory Visit: Payer: Self-pay | Admitting: Internal Medicine

## 2013-05-16 LAB — BASIC METABOLIC PANEL
Anion Gap: 10 (ref 7–16)
BUN: 9 mg/dL (ref 7–18)
CHLORIDE: 101 mmol/L (ref 98–107)
CREATININE: 1.31 mg/dL — AB (ref 0.60–1.30)
Calcium, Total: 8.4 mg/dL — ABNORMAL LOW (ref 8.5–10.1)
Co2: 29 mmol/L (ref 21–32)
EGFR (African American): 48 — ABNORMAL LOW
EGFR (Non-African Amer.): 41 — ABNORMAL LOW
Glucose: 130 mg/dL — ABNORMAL HIGH (ref 65–99)
Osmolality: 280 (ref 275–301)
Potassium: 3.7 mmol/L (ref 3.5–5.1)
SODIUM: 140 mmol/L (ref 136–145)

## 2013-05-16 LAB — CBC CANCER CENTER
BASOS PCT: 1 %
Basophil #: 0 x10 3/mm (ref 0.0–0.1)
EOS ABS: 0.1 x10 3/mm (ref 0.0–0.7)
EOS PCT: 1.8 %
HCT: 25.2 % — ABNORMAL LOW (ref 35.0–47.0)
HGB: 8.1 g/dL — AB (ref 12.0–16.0)
LYMPHS ABS: 1.6 x10 3/mm (ref 1.0–3.6)
Lymphocyte %: 33.9 %
MCH: 32.6 pg (ref 26.0–34.0)
MCHC: 32.1 g/dL (ref 32.0–36.0)
MCV: 101 fL — AB (ref 80–100)
MONOS PCT: 6.8 %
Monocyte #: 0.3 x10 3/mm (ref 0.2–0.9)
NEUTROS ABS: 2.6 x10 3/mm (ref 1.4–6.5)
NEUTROS PCT: 56.5 %
Platelet: 311 x10 3/mm (ref 150–440)
RBC: 2.49 10*6/uL — ABNORMAL LOW (ref 3.80–5.20)
RDW: 22.1 % — ABNORMAL HIGH (ref 11.5–14.5)
WBC: 4.7 x10 3/mm (ref 3.6–11.0)

## 2013-05-16 LAB — MAGNESIUM: Magnesium: 2.5 mg/dL — ABNORMAL HIGH

## 2013-05-16 LAB — PHOSPHORUS: Phosphorus: 2.3 mg/dL — ABNORMAL LOW (ref 2.5–4.9)

## 2013-05-17 LAB — CULTURE, BLOOD (SINGLE)

## 2013-05-23 LAB — CBC CANCER CENTER
BASOS ABS: 0.1 x10 3/mm (ref 0.0–0.1)
BASOS PCT: 1.3 %
EOS ABS: 0.2 x10 3/mm (ref 0.0–0.7)
Eosinophil %: 4.1 %
HCT: 27.1 % — ABNORMAL LOW (ref 35.0–47.0)
HGB: 8.7 g/dL — ABNORMAL LOW (ref 12.0–16.0)
Lymphocyte #: 2.4 x10 3/mm (ref 1.0–3.6)
Lymphocyte %: 50.6 %
MCH: 33.2 pg (ref 26.0–34.0)
MCHC: 32.3 g/dL (ref 32.0–36.0)
MCV: 103 fL — ABNORMAL HIGH (ref 80–100)
MONOS PCT: 6.3 %
Monocyte #: 0.3 x10 3/mm (ref 0.2–0.9)
Neutrophil #: 1.8 x10 3/mm (ref 1.4–6.5)
Neutrophil %: 37.7 %
Platelet: 262 x10 3/mm (ref 150–440)
RBC: 2.63 10*6/uL — AB (ref 3.80–5.20)
RDW: 22.1 % — AB (ref 11.5–14.5)
WBC: 4.8 x10 3/mm (ref 3.6–11.0)

## 2013-05-23 LAB — BASIC METABOLIC PANEL
Anion Gap: 8 (ref 7–16)
BUN: 11 mg/dL (ref 7–18)
CHLORIDE: 103 mmol/L (ref 98–107)
Calcium, Total: 8.5 mg/dL (ref 8.5–10.1)
Co2: 27 mmol/L (ref 21–32)
Creatinine: 1.18 mg/dL (ref 0.60–1.30)
EGFR (African American): 54 — ABNORMAL LOW
GFR CALC NON AF AMER: 47 — AB
Glucose: 158 mg/dL — ABNORMAL HIGH (ref 65–99)
OSMOLALITY: 278 (ref 275–301)
Potassium: 3.9 mmol/L (ref 3.5–5.1)
Sodium: 138 mmol/L (ref 136–145)

## 2013-05-30 ENCOUNTER — Observation Stay: Payer: Self-pay | Admitting: Internal Medicine

## 2013-05-30 LAB — CBC
HCT: 24.5 % — AB (ref 35.0–47.0)
HGB: 8.1 g/dL — ABNORMAL LOW (ref 12.0–16.0)
MCH: 34.2 pg — AB (ref 26.0–34.0)
MCHC: 33.1 g/dL (ref 32.0–36.0)
MCV: 103 fL — ABNORMAL HIGH (ref 80–100)
Platelet: 233 10*3/uL (ref 150–440)
RBC: 2.37 10*6/uL — ABNORMAL LOW (ref 3.80–5.20)
RDW: 23 % — ABNORMAL HIGH (ref 11.5–14.5)
WBC: 2.4 10*3/uL — AB (ref 3.6–11.0)

## 2013-05-30 LAB — COMPREHENSIVE METABOLIC PANEL
ALT: 34 U/L (ref 12–78)
Albumin: 2.8 g/dL — ABNORMAL LOW (ref 3.4–5.0)
Alkaline Phosphatase: 160 U/L — ABNORMAL HIGH
Anion Gap: 4 — ABNORMAL LOW (ref 7–16)
BUN: 14 mg/dL (ref 7–18)
Bilirubin,Total: 0.4 mg/dL (ref 0.2–1.0)
CALCIUM: 8.6 mg/dL (ref 8.5–10.1)
Chloride: 104 mmol/L (ref 98–107)
Co2: 29 mmol/L (ref 21–32)
Creatinine: 0.9 mg/dL (ref 0.60–1.30)
EGFR (African American): >60
GLUCOSE: 102 mg/dL — AB (ref 65–99)
OSMOLALITY: 274 (ref 275–301)
Potassium: 4.2 mmol/L (ref 3.5–5.1)
SGOT(AST): 35 U/L (ref 15–37)
Sodium: 137 mmol/L (ref 136–145)
Total Protein: 7.7 g/dL (ref 6.4–8.2)

## 2013-05-30 LAB — URINALYSIS, COMPLETE
BILIRUBIN, UR: NEGATIVE
BLOOD: NEGATIVE
Bacteria: NONE SEEN
Glucose,UR: NEGATIVE mg/dL (ref 0–75)
KETONE: NEGATIVE
Nitrite: NEGATIVE
PH: 6 (ref 4.5–8.0)
Protein: NEGATIVE
RBC, UR: NONE SEEN /HPF (ref 0–5)
Specific Gravity: 1.014 (ref 1.003–1.030)
Squamous Epithelial: <1

## 2013-05-31 LAB — BASIC METABOLIC PANEL
Anion Gap: 4 — ABNORMAL LOW (ref 7–16)
BUN: 11 mg/dL (ref 7–18)
CO2: 29 mmol/L (ref 21–32)
Calcium, Total: 8.2 mg/dL — ABNORMAL LOW (ref 8.5–10.1)
Chloride: 108 mmol/L — ABNORMAL HIGH (ref 98–107)
Creatinine: 0.78 mg/dL (ref 0.60–1.30)
EGFR (African American): >60
Glucose: 108 mg/dL — ABNORMAL HIGH (ref 65–99)
OSMOLALITY: 281 (ref 275–301)
POTASSIUM: 3.9 mmol/L (ref 3.5–5.1)
Sodium: 141 mmol/L (ref 136–145)

## 2013-05-31 LAB — CBC WITH DIFFERENTIAL/PLATELET
Basophil #: 0 10*3/uL (ref 0.0–0.1)
Basophil %: 0.9 %
Eosinophil #: 0.1 10*3/uL (ref 0.0–0.7)
Eosinophil %: 6.9 %
HCT: 21.7 % — ABNORMAL LOW (ref 35.0–47.0)
HGB: 7.2 g/dL — AB (ref 12.0–16.0)
Lymphocyte #: 1 10*3/uL (ref 1.0–3.6)
Lymphocyte %: 56.4 %
MCH: 34.1 pg — ABNORMAL HIGH (ref 26.0–34.0)
MCHC: 33.2 g/dL (ref 32.0–36.0)
MCV: 103 fL — AB (ref 80–100)
MONOS PCT: 14.5 %
Monocyte #: 0.3 x10 3/mm (ref 0.2–0.9)
NEUTROS ABS: 0.4 10*3/uL — AB (ref 1.4–6.5)
Neutrophil %: 21.3 %
PLATELETS: 220 10*3/uL (ref 150–440)
RBC: 2.11 10*6/uL — ABNORMAL LOW (ref 3.80–5.20)
RDW: 23.2 % — ABNORMAL HIGH (ref 11.5–14.5)
WBC: 1.8 10*3/uL — AB (ref 3.6–11.0)

## 2013-06-01 LAB — CBC WITH DIFFERENTIAL/PLATELET
Eosinophil: 1 %
HCT: 24.1 % — ABNORMAL LOW (ref 35.0–47.0)
HGB: 7.8 g/dL — AB (ref 12.0–16.0)
Lymphocytes: 48 %
MCH: 33.5 pg (ref 26.0–34.0)
MCHC: 32.1 g/dL (ref 32.0–36.0)
MCV: 104 fL — ABNORMAL HIGH (ref 80–100)
Monocytes: 13 %
PLATELETS: 228 10*3/uL (ref 150–440)
RBC: 2.32 10*6/uL — ABNORMAL LOW (ref 3.80–5.20)
RDW: 23.3 % — ABNORMAL HIGH (ref 11.5–14.5)
SEGMENTED NEUTROPHILS: 34 %
Variant Lymphocyte - H1-Rlymph: 4 %
WBC: 2.5 10*3/uL — AB (ref 3.6–11.0)

## 2013-06-06 LAB — CBC CANCER CENTER
Basophil #: 0 x10 3/mm (ref 0.0–0.1)
Basophil %: 1.2 %
Eosinophil #: 0 x10 3/mm (ref 0.0–0.7)
Eosinophil %: 0.9 %
HCT: 29.2 % — ABNORMAL LOW (ref 35.0–47.0)
HGB: 9.3 g/dL — ABNORMAL LOW (ref 12.0–16.0)
Lymphocyte #: 1.7 x10 3/mm (ref 1.0–3.6)
Lymphocyte %: 42.9 %
MCH: 33.5 pg (ref 26.0–34.0)
MCHC: 31.7 g/dL — AB (ref 32.0–36.0)
MCV: 106 fL — ABNORMAL HIGH (ref 80–100)
MONO ABS: 1.2 x10 3/mm — AB (ref 0.2–0.9)
Monocyte %: 30.6 %
NEUTROS ABS: 0.9 x10 3/mm — AB (ref 1.4–6.5)
Neutrophil %: 24.4 %
PLATELETS: 355 x10 3/mm (ref 150–440)
RBC: 2.77 10*6/uL — ABNORMAL LOW (ref 3.80–5.20)
RDW: 23 % — AB (ref 11.5–14.5)
WBC: 3.9 x10 3/mm (ref 3.6–11.0)

## 2013-06-06 LAB — BASIC METABOLIC PANEL
Anion Gap: 6 — ABNORMAL LOW (ref 7–16)
BUN: 9 mg/dL (ref 7–18)
CO2: 30 mmol/L (ref 21–32)
CREATININE: 0.89 mg/dL (ref 0.60–1.30)
Calcium, Total: 8.9 mg/dL (ref 8.5–10.1)
Chloride: 103 mmol/L (ref 98–107)
Glucose: 118 mg/dL — ABNORMAL HIGH (ref 65–99)
Osmolality: 277 (ref 275–301)
SODIUM: 139 mmol/L (ref 136–145)

## 2013-06-06 LAB — PHOSPHORUS: Phosphorus: 2.8 mg/dL (ref 2.5–4.9)

## 2013-06-06 LAB — MAGNESIUM: Magnesium: 2.2 mg/dL

## 2013-06-06 LAB — POTASSIUM: POTASSIUM: 3.8 mmol/L (ref 3.5–5.1)

## 2013-06-13 LAB — CBC CANCER CENTER
Basophil #: 0.1 x10 3/mm (ref 0.0–0.1)
Basophil %: 2.7 %
Eosinophil #: 0 x10 3/mm (ref 0.0–0.7)
Eosinophil %: 0.8 %
HCT: 28.8 % — AB (ref 35.0–47.0)
HGB: 9.6 g/dL — AB (ref 12.0–16.0)
LYMPHS PCT: 24.3 %
Lymphocyte #: 0.8 x10 3/mm — ABNORMAL LOW (ref 1.0–3.6)
MCH: 34.8 pg — ABNORMAL HIGH (ref 26.0–34.0)
MCHC: 33.3 g/dL (ref 32.0–36.0)
MCV: 105 fL — AB (ref 80–100)
Monocyte #: 0.3 x10 3/mm (ref 0.2–0.9)
Monocyte %: 10.7 %
NEUTROS PCT: 61.5 %
Neutrophil #: 1.9 x10 3/mm (ref 1.4–6.5)
PLATELETS: 323 x10 3/mm (ref 150–440)
RBC: 2.75 10*6/uL — ABNORMAL LOW (ref 3.80–5.20)
RDW: 21.5 % — ABNORMAL HIGH (ref 11.5–14.5)
WBC: 3.1 x10 3/mm — ABNORMAL LOW (ref 3.6–11.0)

## 2013-06-13 LAB — BASIC METABOLIC PANEL
ANION GAP: 3 — AB (ref 7–16)
BUN: 11 mg/dL (ref 7–18)
CALCIUM: 9.2 mg/dL (ref 8.5–10.1)
CHLORIDE: 105 mmol/L (ref 98–107)
CREATININE: 1.16 mg/dL (ref 0.60–1.30)
Co2: 32 mmol/L (ref 21–32)
EGFR (African American): 55 — ABNORMAL LOW
EGFR (Non-African Amer.): 48 — ABNORMAL LOW
Glucose: 131 mg/dL — ABNORMAL HIGH (ref 65–99)
OSMOLALITY: 281 (ref 275–301)
POTASSIUM: 3.9 mmol/L (ref 3.5–5.1)
SODIUM: 140 mmol/L (ref 136–145)

## 2013-06-14 ENCOUNTER — Ambulatory Visit: Payer: Self-pay | Admitting: Internal Medicine

## 2013-06-20 LAB — CBC CANCER CENTER
BASOS PCT: 0.5 %
Basophil #: 0 x10 3/mm (ref 0.0–0.1)
EOS PCT: 5.2 %
Eosinophil #: 0.1 x10 3/mm (ref 0.0–0.7)
HCT: 28.3 % — AB (ref 35.0–47.0)
HGB: 9.4 g/dL — ABNORMAL LOW (ref 12.0–16.0)
Lymphocyte #: 0.5 x10 3/mm — ABNORMAL LOW (ref 1.0–3.6)
Lymphocyte %: 16.5 %
MCH: 35.5 pg — AB (ref 26.0–34.0)
MCHC: 33.1 g/dL (ref 32.0–36.0)
MCV: 107 fL — AB (ref 80–100)
MONO ABS: 0.2 x10 3/mm (ref 0.2–0.9)
Monocyte %: 6.5 %
NEUTROS ABS: 2 x10 3/mm (ref 1.4–6.5)
NEUTROS PCT: 71.3 %
Platelet: 226 x10 3/mm (ref 150–440)
RBC: 2.64 10*6/uL — ABNORMAL LOW (ref 3.80–5.20)
RDW: 20.3 % — AB (ref 11.5–14.5)
WBC: 2.8 x10 3/mm — AB (ref 3.6–11.0)

## 2013-06-20 LAB — HEPATIC FUNCTION PANEL A (ARMC)
ALT: 21 U/L (ref 12–78)
Albumin: 2.8 g/dL — ABNORMAL LOW (ref 3.4–5.0)
Alkaline Phosphatase: 95 U/L
BILIRUBIN DIRECT: 0.1 mg/dL (ref 0.00–0.20)
BILIRUBIN TOTAL: 0.4 mg/dL (ref 0.2–1.0)
SGOT(AST): 24 U/L (ref 15–37)
TOTAL PROTEIN: 8.5 g/dL — AB (ref 6.4–8.2)

## 2013-06-20 LAB — BASIC METABOLIC PANEL
Anion Gap: 5 — ABNORMAL LOW (ref 7–16)
BUN: 10 mg/dL (ref 7–18)
Calcium, Total: 8.4 mg/dL — ABNORMAL LOW (ref 8.5–10.1)
Chloride: 101 mmol/L (ref 98–107)
Co2: 31 mmol/L (ref 21–32)
Creatinine: 1 mg/dL (ref 0.60–1.30)
EGFR (African American): >60
GFR CALC NON AF AMER: 57 — AB
Glucose: 132 mg/dL — ABNORMAL HIGH (ref 65–99)
OSMOLALITY: 275 (ref 275–301)
Potassium: 4.1 mmol/L (ref 3.5–5.1)
Sodium: 137 mmol/L (ref 136–145)

## 2013-06-20 LAB — PHOSPHORUS: Phosphorus: 2.8 mg/dL (ref 2.5–4.9)

## 2013-06-20 LAB — MAGNESIUM: Magnesium: 1.7 mg/dL — ABNORMAL LOW

## 2013-06-27 LAB — CBC CANCER CENTER
BASOS PCT: 0.5 %
Basophil #: 0 x10 3/mm (ref 0.0–0.1)
Eosinophil #: 0.4 x10 3/mm (ref 0.0–0.7)
Eosinophil %: 19.8 %
HCT: 31.4 % — ABNORMAL LOW (ref 35.0–47.0)
HGB: 10.2 g/dL — AB (ref 12.0–16.0)
LYMPHS ABS: 0.6 x10 3/mm — AB (ref 1.0–3.6)
Lymphocyte %: 28.1 %
MCH: 34.8 pg — ABNORMAL HIGH (ref 26.0–34.0)
MCHC: 32.6 g/dL (ref 32.0–36.0)
MCV: 107 fL — AB (ref 80–100)
Monocyte #: 0.2 x10 3/mm (ref 0.2–0.9)
Monocyte %: 8.3 %
Neutrophil #: 0.9 x10 3/mm — ABNORMAL LOW (ref 1.4–6.5)
Neutrophil %: 43.3 %
Platelet: 175 x10 3/mm (ref 150–440)
RBC: 2.95 10*6/uL — AB (ref 3.80–5.20)
RDW: 18.2 % — AB (ref 11.5–14.5)
WBC: 2 x10 3/mm — CL (ref 3.6–11.0)

## 2013-06-27 LAB — BASIC METABOLIC PANEL
Anion Gap: 7 (ref 7–16)
BUN: 10 mg/dL (ref 7–18)
CO2: 31 mmol/L (ref 21–32)
CREATININE: 1 mg/dL (ref 0.60–1.30)
Calcium, Total: 8.7 mg/dL (ref 8.5–10.1)
Chloride: 100 mmol/L (ref 98–107)
EGFR (African American): >60
EGFR (Non-African Amer.): 57 — ABNORMAL LOW
Glucose: 133 mg/dL — ABNORMAL HIGH (ref 65–99)
Osmolality: 277 (ref 275–301)
Potassium: 3.7 mmol/L (ref 3.5–5.1)
Sodium: 138 mmol/L (ref 136–145)

## 2013-07-04 LAB — CBC CANCER CENTER
BASOS PCT: 2.1 %
Basophil #: 0 x10 3/mm (ref 0.0–0.1)
EOS ABS: 0.1 x10 3/mm (ref 0.0–0.7)
EOS PCT: 2.8 %
HCT: 30.6 % — AB (ref 35.0–47.0)
HGB: 9.9 g/dL — ABNORMAL LOW (ref 12.0–16.0)
LYMPHS ABS: 0.8 x10 3/mm — AB (ref 1.0–3.6)
LYMPHS PCT: 41.8 %
MCH: 34.4 pg — ABNORMAL HIGH (ref 26.0–34.0)
MCHC: 32.5 g/dL (ref 32.0–36.0)
MCV: 106 fL — AB (ref 80–100)
MONOS PCT: 25.9 %
Monocyte #: 0.5 x10 3/mm (ref 0.2–0.9)
NEUTROS PCT: 27.4 %
Neutrophil #: 0.5 x10 3/mm — ABNORMAL LOW (ref 1.4–6.5)
Platelet: 256 x10 3/mm (ref 150–440)
RBC: 2.89 10*6/uL — ABNORMAL LOW (ref 3.80–5.20)
RDW: 17.7 % — ABNORMAL HIGH (ref 11.5–14.5)
WBC: 1.9 x10 3/mm — AB (ref 3.6–11.0)

## 2013-07-04 LAB — BASIC METABOLIC PANEL
Anion Gap: 8 (ref 7–16)
BUN: 11 mg/dL (ref 7–18)
CREATININE: 1.1 mg/dL (ref 0.60–1.30)
Calcium, Total: 8.7 mg/dL (ref 8.5–10.1)
Chloride: 102 mmol/L (ref 98–107)
Co2: 28 mmol/L (ref 21–32)
EGFR (Non-African Amer.): 51 — ABNORMAL LOW
GFR CALC AF AMER: 59 — AB
Glucose: 113 mg/dL — ABNORMAL HIGH (ref 65–99)
OSMOLALITY: 276 (ref 275–301)
POTASSIUM: 3.8 mmol/L (ref 3.5–5.1)
Sodium: 138 mmol/L (ref 136–145)

## 2013-07-11 LAB — BASIC METABOLIC PANEL
Anion Gap: 4 — ABNORMAL LOW (ref 7–16)
BUN: 9 mg/dL (ref 7–18)
CO2: 32 mmol/L (ref 21–32)
Calcium, Total: 8.5 mg/dL (ref 8.5–10.1)
Chloride: 100 mmol/L (ref 98–107)
Creatinine: 1.14 mg/dL (ref 0.60–1.30)
GFR CALC AF AMER: 56 — AB
GFR CALC NON AF AMER: 49 — AB
Glucose: 120 mg/dL — ABNORMAL HIGH (ref 65–99)
OSMOLALITY: 272 (ref 275–301)
Potassium: 4.3 mmol/L (ref 3.5–5.1)
SODIUM: 136 mmol/L (ref 136–145)

## 2013-07-11 LAB — CBC CANCER CENTER
BASOS PCT: 2.8 %
Basophil #: 0.1 x10 3/mm (ref 0.0–0.1)
Eosinophil #: 0 x10 3/mm (ref 0.0–0.7)
Eosinophil %: 1.2 %
HCT: 28.6 % — AB (ref 35.0–47.0)
HGB: 9.4 g/dL — ABNORMAL LOW (ref 12.0–16.0)
Lymphocyte #: 0.6 x10 3/mm — ABNORMAL LOW (ref 1.0–3.6)
Lymphocyte %: 26.6 %
MCH: 35 pg — ABNORMAL HIGH (ref 26.0–34.0)
MCHC: 32.9 g/dL (ref 32.0–36.0)
MCV: 106 fL — AB (ref 80–100)
Monocyte #: 0.4 x10 3/mm (ref 0.2–0.9)
Monocyte %: 15.4 %
Neutrophil #: 1.3 x10 3/mm — ABNORMAL LOW (ref 1.4–6.5)
Neutrophil %: 54 %
Platelet: 271 x10 3/mm (ref 150–440)
RBC: 2.69 10*6/uL — ABNORMAL LOW (ref 3.80–5.20)
RDW: 17.3 % — AB (ref 11.5–14.5)
WBC: 2.4 x10 3/mm — AB (ref 3.6–11.0)

## 2013-07-11 LAB — MAGNESIUM: Magnesium: 1.9 mg/dL

## 2013-07-11 LAB — HEPATIC FUNCTION PANEL A (ARMC)
Albumin: 2.8 g/dL — ABNORMAL LOW (ref 3.4–5.0)
Alkaline Phosphatase: 85 U/L
Bilirubin, Direct: 0.3 mg/dL — ABNORMAL HIGH (ref 0.00–0.20)
Bilirubin,Total: 0.2 mg/dL (ref 0.2–1.0)
SGOT(AST): 32 U/L (ref 15–37)
SGPT (ALT): 20 U/L (ref 12–78)
TOTAL PROTEIN: 9.2 g/dL — AB (ref 6.4–8.2)

## 2013-07-11 LAB — PHOSPHORUS: PHOSPHORUS: 3.2 mg/dL (ref 2.5–4.9)

## 2013-07-15 ENCOUNTER — Ambulatory Visit: Payer: Self-pay | Admitting: Internal Medicine

## 2013-07-18 LAB — CBC CANCER CENTER
BASOS ABS: 0 x10 3/mm (ref 0.0–0.1)
Basophil %: 0.8 %
Eosinophil #: 0.1 x10 3/mm (ref 0.0–0.7)
Eosinophil %: 2.4 %
HCT: 30 % — AB (ref 35.0–47.0)
HGB: 9.6 g/dL — ABNORMAL LOW (ref 12.0–16.0)
Lymphocyte #: 0.6 x10 3/mm — ABNORMAL LOW (ref 1.0–3.6)
Lymphocyte %: 25.1 %
MCH: 33.8 pg (ref 26.0–34.0)
MCHC: 32 g/dL (ref 32.0–36.0)
MCV: 105 fL — ABNORMAL HIGH (ref 80–100)
Monocyte #: 0.1 x10 3/mm — ABNORMAL LOW (ref 0.2–0.9)
Monocyte %: 3.6 %
NEUTROS ABS: 1.7 x10 3/mm (ref 1.4–6.5)
Neutrophil %: 68.1 %
PLATELETS: 158 x10 3/mm (ref 150–440)
RBC: 2.84 10*6/uL — AB (ref 3.80–5.20)
RDW: 16.7 % — AB (ref 11.5–14.5)
WBC: 2.5 x10 3/mm — ABNORMAL LOW (ref 3.6–11.0)

## 2013-07-18 LAB — BASIC METABOLIC PANEL
Anion Gap: 4 — ABNORMAL LOW (ref 7–16)
BUN: 11 mg/dL (ref 7–18)
CALCIUM: 8.2 mg/dL — AB (ref 8.5–10.1)
Chloride: 102 mmol/L (ref 98–107)
Co2: 33 mmol/L — ABNORMAL HIGH (ref 21–32)
Creatinine: 1.16 mg/dL (ref 0.60–1.30)
EGFR (Non-African Amer.): 47 — ABNORMAL LOW
GFR CALC AF AMER: 55 — AB
Glucose: 110 mg/dL — ABNORMAL HIGH (ref 65–99)
Osmolality: 278 (ref 275–301)
Potassium: 3.5 mmol/L (ref 3.5–5.1)
Sodium: 139 mmol/L (ref 136–145)

## 2013-07-25 LAB — CBC CANCER CENTER
BASOS ABS: 0 x10 3/mm (ref 0.0–0.1)
Basophil %: 0.7 %
Eosinophil #: 0.4 x10 3/mm (ref 0.0–0.7)
Eosinophil %: 15.3 %
HCT: 29.2 % — ABNORMAL LOW (ref 35.0–47.0)
HGB: 9.5 g/dL — ABNORMAL LOW (ref 12.0–16.0)
LYMPHS PCT: 50.1 %
Lymphocyte #: 1.2 x10 3/mm (ref 1.0–3.6)
MCH: 34 pg (ref 26.0–34.0)
MCHC: 32.5 g/dL (ref 32.0–36.0)
MCV: 105 fL — ABNORMAL HIGH (ref 80–100)
Monocyte #: 0.2 x10 3/mm (ref 0.2–0.9)
Monocyte %: 6.7 %
Neutrophil #: 0.6 x10 3/mm — ABNORMAL LOW (ref 1.4–6.5)
Neutrophil %: 27.2 %
Platelet: 143 x10 3/mm — ABNORMAL LOW (ref 150–440)
RBC: 2.8 10*6/uL — ABNORMAL LOW (ref 3.80–5.20)
RDW: 16.6 % — ABNORMAL HIGH (ref 11.5–14.5)
WBC: 2.4 x10 3/mm — ABNORMAL LOW (ref 3.6–11.0)

## 2013-07-25 LAB — HEPATIC FUNCTION PANEL A (ARMC)
ALBUMIN: 2.7 g/dL — AB (ref 3.4–5.0)
ALT: 31 U/L (ref 12–78)
AST: 39 U/L — AB (ref 15–37)
Alkaline Phosphatase: 93 U/L
BILIRUBIN DIRECT: 0.1 mg/dL (ref 0.00–0.20)
Bilirubin,Total: 0.3 mg/dL (ref 0.2–1.0)
Total Protein: 9.1 g/dL — ABNORMAL HIGH (ref 6.4–8.2)

## 2013-07-25 LAB — BASIC METABOLIC PANEL
ANION GAP: 3 — AB (ref 7–16)
BUN: 10 mg/dL (ref 7–18)
CO2: 33 mmol/L — AB (ref 21–32)
Calcium, Total: 8.6 mg/dL (ref 8.5–10.1)
Chloride: 102 mmol/L (ref 98–107)
Creatinine: 1.1 mg/dL (ref 0.60–1.30)
EGFR (African American): 58 — ABNORMAL LOW
EGFR (Non-African Amer.): 50 — ABNORMAL LOW
Glucose: 105 mg/dL — ABNORMAL HIGH (ref 65–99)
Osmolality: 275 (ref 275–301)
Potassium: 3.8 mmol/L (ref 3.5–5.1)
Sodium: 138 mmol/L (ref 136–145)

## 2013-07-25 LAB — PHOSPHORUS: PHOSPHORUS: 3.8 mg/dL (ref 2.5–4.9)

## 2013-07-25 LAB — MAGNESIUM: MAGNESIUM: 2.3 mg/dL

## 2013-08-02 LAB — CBC CANCER CENTER
Basophil #: 0.1 x10 3/mm (ref 0.0–0.1)
Basophil %: 2.2 %
Eosinophil #: 0 x10 3/mm (ref 0.0–0.7)
Eosinophil %: 1.4 %
HCT: 29 % — AB (ref 35.0–47.0)
HGB: 9.5 g/dL — AB (ref 12.0–16.0)
LYMPHS PCT: 52.6 %
Lymphocyte #: 1.5 x10 3/mm (ref 1.0–3.6)
MCH: 34 pg (ref 26.0–34.0)
MCHC: 32.6 g/dL (ref 32.0–36.0)
MCV: 104 fL — ABNORMAL HIGH (ref 80–100)
Monocyte #: 0.8 x10 3/mm (ref 0.2–0.9)
Monocyte %: 29.2 %
NEUTROS ABS: 0.4 x10 3/mm — AB (ref 1.4–6.5)
Neutrophil %: 14.6 %
PLATELETS: 292 x10 3/mm (ref 150–440)
RBC: 2.78 10*6/uL — ABNORMAL LOW (ref 3.80–5.20)
RDW: 16.5 % — ABNORMAL HIGH (ref 11.5–14.5)
WBC: 2.8 x10 3/mm — ABNORMAL LOW (ref 3.6–11.0)

## 2013-08-08 LAB — CBC CANCER CENTER
BASOS ABS: 0.1 x10 3/mm (ref 0.0–0.1)
Basophil %: 3.2 %
EOS PCT: 0.2 %
Eosinophil #: 0 x10 3/mm (ref 0.0–0.7)
HCT: 29.1 % — ABNORMAL LOW (ref 35.0–47.0)
HGB: 9.5 g/dL — ABNORMAL LOW (ref 12.0–16.0)
Lymphocyte #: 1.5 x10 3/mm (ref 1.0–3.6)
Lymphocyte %: 37.6 %
MCH: 33.7 pg (ref 26.0–34.0)
MCHC: 32.6 g/dL (ref 32.0–36.0)
MCV: 103 fL — ABNORMAL HIGH (ref 80–100)
MONOS PCT: 13.4 %
Monocyte #: 0.5 x10 3/mm (ref 0.2–0.9)
NEUTROS ABS: 1.8 x10 3/mm (ref 1.4–6.5)
Neutrophil %: 45.6 %
PLATELETS: 243 x10 3/mm (ref 150–440)
RBC: 2.82 10*6/uL — ABNORMAL LOW (ref 3.80–5.20)
RDW: 16.8 % — AB (ref 11.5–14.5)
WBC: 3.9 x10 3/mm (ref 3.6–11.0)

## 2013-08-14 ENCOUNTER — Ambulatory Visit: Payer: Self-pay | Admitting: Internal Medicine

## 2013-08-15 LAB — CBC CANCER CENTER
BASOS ABS: 0 x10 3/mm (ref 0.0–0.1)
Basophil %: 0.8 %
EOS ABS: 0.1 x10 3/mm (ref 0.0–0.7)
Eosinophil %: 1.6 %
HCT: 27.1 % — ABNORMAL LOW (ref 35.0–47.0)
HGB: 8.8 g/dL — AB (ref 12.0–16.0)
LYMPHS ABS: 1.2 x10 3/mm (ref 1.0–3.6)
Lymphocyte %: 33.2 %
MCH: 33.3 pg (ref 26.0–34.0)
MCHC: 32.4 g/dL (ref 32.0–36.0)
MCV: 103 fL — ABNORMAL HIGH (ref 80–100)
MONO ABS: 0.2 x10 3/mm (ref 0.2–0.9)
Monocyte %: 5.1 %
Neutrophil #: 2.1 x10 3/mm (ref 1.4–6.5)
Neutrophil %: 59.3 %
PLATELETS: 148 x10 3/mm — AB (ref 150–440)
RBC: 2.64 10*6/uL — AB (ref 3.80–5.20)
RDW: 16.3 % — ABNORMAL HIGH (ref 11.5–14.5)
WBC: 3.6 x10 3/mm (ref 3.6–11.0)

## 2013-08-15 LAB — HEPATIC FUNCTION PANEL A (ARMC)
ALBUMIN: 2.5 g/dL — AB (ref 3.4–5.0)
ALT: 57 U/L (ref 12–78)
AST: 83 U/L — AB (ref 15–37)
Alkaline Phosphatase: 161 U/L — ABNORMAL HIGH
Bilirubin, Direct: 0.1 mg/dL (ref 0.00–0.20)
Bilirubin,Total: 0.3 mg/dL (ref 0.2–1.0)
Total Protein: 10.1 g/dL — ABNORMAL HIGH (ref 6.4–8.2)

## 2013-08-15 LAB — BASIC METABOLIC PANEL
Anion Gap: 2 — ABNORMAL LOW (ref 7–16)
BUN: 13 mg/dL (ref 7–18)
CALCIUM: 8.2 mg/dL — AB (ref 8.5–10.1)
CHLORIDE: 102 mmol/L (ref 98–107)
CREATININE: 1.24 mg/dL (ref 0.60–1.30)
Co2: 33 mmol/L — ABNORMAL HIGH (ref 21–32)
EGFR (African American): 51 — ABNORMAL LOW
EGFR (Non-African Amer.): 44 — ABNORMAL LOW
Glucose: 111 mg/dL — ABNORMAL HIGH (ref 65–99)
OSMOLALITY: 275 (ref 275–301)
POTASSIUM: 4.4 mmol/L (ref 3.5–5.1)
SODIUM: 137 mmol/L (ref 136–145)

## 2013-08-15 LAB — MAGNESIUM: Magnesium: 2.2 mg/dL

## 2013-08-15 LAB — PHOSPHORUS: Phosphorus: 4 mg/dL (ref 2.5–4.9)

## 2013-08-22 LAB — CBC CANCER CENTER
BASOS ABS: 0 x10 3/mm (ref 0.0–0.1)
BASOS PCT: 1.2 %
EOS PCT: 7.6 %
Eosinophil #: 0.2 x10 3/mm (ref 0.0–0.7)
HCT: 29 % — AB (ref 35.0–47.0)
HGB: 9.4 g/dL — AB (ref 12.0–16.0)
Lymphocyte #: 1.3 x10 3/mm (ref 1.0–3.6)
Lymphocyte %: 43.2 %
MCH: 33 pg (ref 26.0–34.0)
MCHC: 32.3 g/dL (ref 32.0–36.0)
MCV: 102 fL — AB (ref 80–100)
MONOS PCT: 13.7 %
Monocyte #: 0.4 x10 3/mm (ref 0.2–0.9)
Neutrophil #: 1 x10 3/mm — ABNORMAL LOW (ref 1.4–6.5)
Neutrophil %: 34.3 %
PLATELETS: 171 x10 3/mm (ref 150–440)
RBC: 2.84 10*6/uL — AB (ref 3.80–5.20)
RDW: 17.1 % — ABNORMAL HIGH (ref 11.5–14.5)
WBC: 3 x10 3/mm — ABNORMAL LOW (ref 3.6–11.0)

## 2013-08-29 LAB — CBC CANCER CENTER
BASOS ABS: 0 x10 3/mm (ref 0.0–0.1)
Basophil %: 0.9 %
Eosinophil #: 0.1 x10 3/mm (ref 0.0–0.7)
Eosinophil %: 3.1 %
HCT: 26.9 % — ABNORMAL LOW (ref 35.0–47.0)
HGB: 8.6 g/dL — AB (ref 12.0–16.0)
LYMPHS ABS: 1 x10 3/mm (ref 1.0–3.6)
Lymphocyte %: 48.7 %
MCH: 32.6 pg (ref 26.0–34.0)
MCHC: 32 g/dL (ref 32.0–36.0)
MCV: 102 fL — AB (ref 80–100)
MONO ABS: 0.4 x10 3/mm (ref 0.2–0.9)
MONOS PCT: 21.7 %
Neutrophil #: 0.5 x10 3/mm — ABNORMAL LOW (ref 1.4–6.5)
Neutrophil %: 25.6 %
PLATELETS: 194 x10 3/mm (ref 150–440)
RBC: 2.65 10*6/uL — ABNORMAL LOW (ref 3.80–5.20)
RDW: 16.9 % — ABNORMAL HIGH (ref 11.5–14.5)
WBC: 2 x10 3/mm — AB (ref 3.6–11.0)

## 2013-09-05 LAB — HEPATIC FUNCTION PANEL A (ARMC)
ALT: 36 U/L
AST: 75 U/L — AB (ref 15–37)
Albumin: 2.4 g/dL — ABNORMAL LOW (ref 3.4–5.0)
Alkaline Phosphatase: 135 U/L — ABNORMAL HIGH
Bilirubin, Direct: 0.1 mg/dL (ref 0.00–0.20)
Bilirubin,Total: 0.3 mg/dL (ref 0.2–1.0)
TOTAL PROTEIN: 11.7 g/dL — AB (ref 6.4–8.2)

## 2013-09-05 LAB — BASIC METABOLIC PANEL
Anion Gap: 3 — ABNORMAL LOW (ref 7–16)
BUN: 11 mg/dL (ref 7–18)
Calcium, Total: 8.9 mg/dL (ref 8.5–10.1)
Chloride: 100 mmol/L (ref 98–107)
Co2: 30 mmol/L (ref 21–32)
Creatinine: 1.48 mg/dL — ABNORMAL HIGH (ref 0.60–1.30)
GFR CALC AF AMER: 41 — AB
GFR CALC NON AF AMER: 35 — AB
Glucose: 104 mg/dL — ABNORMAL HIGH (ref 65–99)
Osmolality: 266 (ref 275–301)
POTASSIUM: 3.8 mmol/L (ref 3.5–5.1)
Sodium: 133 mmol/L — ABNORMAL LOW (ref 136–145)

## 2013-09-05 LAB — CBC CANCER CENTER
Basophil #: 0.1 x10 3/mm (ref 0.0–0.1)
Basophil %: 3.2 %
Eosinophil #: 0 x10 3/mm (ref 0.0–0.7)
Eosinophil %: 0.2 %
HCT: 26 % — AB (ref 35.0–47.0)
HGB: 8.4 g/dL — ABNORMAL LOW (ref 12.0–16.0)
LYMPHS ABS: 1.1 x10 3/mm (ref 1.0–3.6)
Lymphocyte %: 32.9 %
MCH: 32.4 pg (ref 26.0–34.0)
MCHC: 32.3 g/dL (ref 32.0–36.0)
MCV: 100 fL (ref 80–100)
MONO ABS: 0.9 x10 3/mm (ref 0.2–0.9)
Monocyte %: 25.5 %
Neutrophil #: 1.3 x10 3/mm — ABNORMAL LOW (ref 1.4–6.5)
Neutrophil %: 38.2 %
PLATELETS: 214 x10 3/mm (ref 150–440)
RBC: 2.59 10*6/uL — AB (ref 3.80–5.20)
RDW: 17.4 % — AB (ref 11.5–14.5)
WBC: 3.4 x10 3/mm — ABNORMAL LOW (ref 3.6–11.0)

## 2013-09-05 LAB — PHOSPHORUS: PHOSPHORUS: 4.2 mg/dL (ref 2.5–4.9)

## 2013-09-05 LAB — MAGNESIUM: MAGNESIUM: 1.7 mg/dL — AB

## 2013-09-09 LAB — PROT IMMUNOELECTROPHORES(ARMC)

## 2013-09-09 LAB — KAPPA/LAMBDA FREE LIGHT CHAINS (ARMC)

## 2013-09-14 ENCOUNTER — Ambulatory Visit: Payer: Self-pay | Admitting: Internal Medicine

## 2013-09-16 LAB — BASIC METABOLIC PANEL
Anion Gap: 2 — ABNORMAL LOW (ref 7–16)
BUN: 14 mg/dL (ref 7–18)
CALCIUM: 8.9 mg/dL (ref 8.5–10.1)
CREATININE: 1.98 mg/dL — AB (ref 0.60–1.30)
Chloride: 95 mmol/L — ABNORMAL LOW (ref 98–107)
Co2: 32 mmol/L (ref 21–32)
GFR CALC AF AMER: 29 — AB
GFR CALC NON AF AMER: 25 — AB
Glucose: 105 mg/dL — ABNORMAL HIGH (ref 65–99)
Osmolality: 260 (ref 275–301)
Potassium: 4.2 mmol/L (ref 3.5–5.1)
SODIUM: 129 mmol/L — AB (ref 136–145)

## 2013-09-16 LAB — CBC CANCER CENTER
Basophil #: 0 x10 3/mm (ref 0.0–0.1)
Basophil %: 1.3 %
Eosinophil #: 0 x10 3/mm (ref 0.0–0.7)
Eosinophil %: 0.5 %
HCT: 22.2 % — ABNORMAL LOW (ref 35.0–47.0)
HGB: 7.2 g/dL — AB (ref 12.0–16.0)
Lymphocyte #: 0.5 x10 3/mm — ABNORMAL LOW (ref 1.0–3.6)
Lymphocyte %: 12.8 %
MCH: 32.4 pg (ref 26.0–34.0)
MCHC: 32.7 g/dL (ref 32.0–36.0)
MCV: 99 fL (ref 80–100)
MONOS PCT: 10.9 %
Monocyte #: 0.4 x10 3/mm (ref 0.2–0.9)
Neutrophil #: 2.7 x10 3/mm (ref 1.4–6.5)
Neutrophil %: 74.5 %
Platelet: 181 x10 3/mm (ref 150–440)
RBC: 2.24 10*6/uL — ABNORMAL LOW (ref 3.80–5.20)
RDW: 17.1 % — AB (ref 11.5–14.5)
WBC: 3.6 x10 3/mm (ref 3.6–11.0)

## 2013-09-23 LAB — CBC CANCER CENTER
Basophil #: 0 x10 3/mm (ref 0.0–0.1)
Basophil %: 0.7 %
EOS ABS: 0 x10 3/mm (ref 0.0–0.7)
Eosinophil %: 3.5 %
HCT: 23.7 % — ABNORMAL LOW (ref 35.0–47.0)
HGB: 7.8 g/dL — AB (ref 12.0–16.0)
LYMPHS ABS: 0.1 x10 3/mm — AB (ref 1.0–3.6)
Lymphocyte %: 7 %
MCH: 32.2 pg (ref 26.0–34.0)
MCHC: 32.9 g/dL (ref 32.0–36.0)
MCV: 98 fL (ref 80–100)
Monocyte #: 0.2 x10 3/mm (ref 0.2–0.9)
Monocyte %: 18 %
Neutrophil #: 0.9 x10 3/mm — ABNORMAL LOW (ref 1.4–6.5)
Neutrophil %: 70.8 %
PLATELETS: 155 x10 3/mm (ref 150–440)
RBC: 2.42 10*6/uL — ABNORMAL LOW (ref 3.80–5.20)
RDW: 17.5 % — AB (ref 11.5–14.5)
WBC: 1.3 x10 3/mm — CL (ref 3.6–11.0)

## 2013-09-23 LAB — BASIC METABOLIC PANEL
Anion Gap: 5 — ABNORMAL LOW (ref 7–16)
BUN: 9 mg/dL (ref 7–18)
CALCIUM: 8.1 mg/dL — AB (ref 8.5–10.1)
CHLORIDE: 93 mmol/L — AB (ref 98–107)
CO2: 32 mmol/L (ref 21–32)
Creatinine: 1.24 mg/dL (ref 0.60–1.30)
GFR CALC AF AMER: 51 — AB
GFR CALC NON AF AMER: 44 — AB
GLUCOSE: 117 mg/dL — AB (ref 65–99)
Osmolality: 261 (ref 275–301)
POTASSIUM: 3.4 mmol/L — AB (ref 3.5–5.1)
Sodium: 130 mmol/L — ABNORMAL LOW (ref 136–145)

## 2013-10-03 LAB — CBC CANCER CENTER
Basophil #: 0 x10 3/mm (ref 0.0–0.1)
Basophil %: 0.8 %
Eosinophil #: 0 x10 3/mm (ref 0.0–0.7)
Eosinophil %: 0.4 %
HCT: 24.4 % — ABNORMAL LOW (ref 35.0–47.0)
HGB: 8.1 g/dL — ABNORMAL LOW (ref 12.0–16.0)
LYMPHS PCT: 24.8 %
Lymphocyte #: 0.4 x10 3/mm — ABNORMAL LOW (ref 1.0–3.6)
MCH: 31.7 pg (ref 26.0–34.0)
MCHC: 33.1 g/dL (ref 32.0–36.0)
MCV: 96 fL (ref 80–100)
Monocyte #: 0.3 x10 3/mm (ref 0.2–0.9)
Monocyte %: 22.2 %
NEUTROS PCT: 51.8 %
Neutrophil #: 0.8 x10 3/mm — ABNORMAL LOW (ref 1.4–6.5)
Platelet: 180 x10 3/mm (ref 150–440)
RBC: 2.55 10*6/uL — ABNORMAL LOW (ref 3.80–5.20)
RDW: 17.5 % — ABNORMAL HIGH (ref 11.5–14.5)
WBC: 1.5 x10 3/mm — CL (ref 3.6–11.0)

## 2013-10-03 LAB — BASIC METABOLIC PANEL
ANION GAP: 3 — AB (ref 7–16)
BUN: 13 mg/dL (ref 7–18)
Calcium, Total: 8.2 mg/dL — ABNORMAL LOW (ref 8.5–10.1)
Chloride: 97 mmol/L — ABNORMAL LOW (ref 98–107)
Co2: 35 mmol/L — ABNORMAL HIGH (ref 21–32)
Creatinine: 1.05 mg/dL (ref 0.60–1.30)
GFR CALC NON AF AMER: 53 — AB
Glucose: 129 mg/dL — ABNORMAL HIGH (ref 65–99)
Osmolality: 272 (ref 275–301)
Potassium: 2.7 mmol/L — ABNORMAL LOW (ref 3.5–5.1)
Sodium: 135 mmol/L — ABNORMAL LOW (ref 136–145)

## 2013-10-03 LAB — HEPATIC FUNCTION PANEL A (ARMC)
ALBUMIN: 2.3 g/dL — AB (ref 3.4–5.0)
ALK PHOS: 149 U/L — AB
ALT: 48 U/L
BILIRUBIN DIRECT: 0.1 mg/dL (ref 0.00–0.20)
Bilirubin,Total: 0.3 mg/dL (ref 0.2–1.0)
SGOT(AST): 97 U/L — ABNORMAL HIGH (ref 15–37)
TOTAL PROTEIN: 9.9 g/dL — AB (ref 6.4–8.2)

## 2013-10-10 LAB — CBC CANCER CENTER
BASOS ABS: 0 x10 3/mm (ref 0.0–0.1)
BASOS PCT: 0.4 %
EOS ABS: 0 x10 3/mm (ref 0.0–0.7)
Eosinophil %: 0.5 %
HCT: 27.8 % — ABNORMAL LOW (ref 35.0–47.0)
HGB: 9.1 g/dL — ABNORMAL LOW (ref 12.0–16.0)
LYMPHS PCT: 71.5 %
Lymphocyte #: 2.2 x10 3/mm (ref 1.0–3.6)
MCH: 31.4 pg (ref 26.0–34.0)
MCHC: 32.8 g/dL (ref 32.0–36.0)
MCV: 96 fL (ref 80–100)
MONO ABS: 0.2 x10 3/mm (ref 0.2–0.9)
Monocyte %: 7.4 %
NEUTROS ABS: 0.6 x10 3/mm — AB (ref 1.4–6.5)
Neutrophil %: 20.2 %
Platelet: 115 x10 3/mm — ABNORMAL LOW (ref 150–440)
RBC: 2.91 10*6/uL — ABNORMAL LOW (ref 3.80–5.20)
RDW: 17.7 % — AB (ref 11.5–14.5)
WBC: 3.1 x10 3/mm — AB (ref 3.6–11.0)

## 2013-10-10 LAB — BASIC METABOLIC PANEL
Anion Gap: 6 — ABNORMAL LOW (ref 7–16)
BUN: 9 mg/dL (ref 7–18)
CO2: 33 mmol/L — AB (ref 21–32)
Calcium, Total: 8.5 mg/dL (ref 8.5–10.1)
Chloride: 97 mmol/L — ABNORMAL LOW (ref 98–107)
Creatinine: 0.92 mg/dL (ref 0.60–1.30)
EGFR (African American): >60
GLUCOSE: 141 mg/dL — AB (ref 65–99)
OSMOLALITY: 273 (ref 275–301)
POTASSIUM: 2.8 mmol/L — AB (ref 3.5–5.1)
SODIUM: 136 mmol/L (ref 136–145)

## 2013-10-15 ENCOUNTER — Ambulatory Visit: Payer: Self-pay | Admitting: Internal Medicine

## 2013-10-17 LAB — BASIC METABOLIC PANEL
Anion Gap: 6 — ABNORMAL LOW (ref 7–16)
BUN: 6 mg/dL — AB (ref 7–18)
CALCIUM: 8.5 mg/dL (ref 8.5–10.1)
CHLORIDE: 101 mmol/L (ref 98–107)
Co2: 30 mmol/L (ref 21–32)
Creatinine: 0.92 mg/dL (ref 0.60–1.30)
EGFR (African American): >60
GLUCOSE: 106 mg/dL — AB (ref 65–99)
Osmolality: 272 (ref 275–301)
POTASSIUM: 3.8 mmol/L (ref 3.5–5.1)
Sodium: 137 mmol/L (ref 136–145)

## 2013-10-17 LAB — HEPATIC FUNCTION PANEL A (ARMC)
ALK PHOS: 242 U/L — AB
AST: 59 U/L — AB (ref 15–37)
Albumin: 2.6 g/dL — ABNORMAL LOW (ref 3.4–5.0)
Bilirubin, Direct: 0.1 mg/dL (ref 0.00–0.20)
Bilirubin,Total: 0.3 mg/dL (ref 0.2–1.0)
SGPT (ALT): 45 U/L
Total Protein: 8.4 g/dL — ABNORMAL HIGH (ref 6.4–8.2)

## 2013-10-17 LAB — CBC CANCER CENTER
Basophil #: 0 x10 3/mm (ref 0.0–0.1)
Basophil %: 0.5 %
EOS ABS: 0 x10 3/mm (ref 0.0–0.7)
EOS PCT: 1.1 %
HCT: 26.1 % — ABNORMAL LOW (ref 35.0–47.0)
HGB: 8.5 g/dL — ABNORMAL LOW (ref 12.0–16.0)
LYMPHS PCT: 71.3 %
Lymphocyte #: 1.9 x10 3/mm (ref 1.0–3.6)
MCH: 31.4 pg (ref 26.0–34.0)
MCHC: 32.6 g/dL (ref 32.0–36.0)
MCV: 96 fL (ref 80–100)
MONO ABS: 0.3 x10 3/mm (ref 0.2–0.9)
Monocyte %: 12.3 %
Neutrophil #: 0.4 x10 3/mm — ABNORMAL LOW (ref 1.4–6.5)
Neutrophil %: 14.8 %
PLATELETS: 139 x10 3/mm — AB (ref 150–440)
RBC: 2.71 10*6/uL — ABNORMAL LOW (ref 3.80–5.20)
RDW: 18.5 % — ABNORMAL HIGH (ref 11.5–14.5)
WBC: 2.7 x10 3/mm — ABNORMAL LOW (ref 3.6–11.0)

## 2013-10-24 LAB — CBC CANCER CENTER
BASOS ABS: 0 x10 3/mm (ref 0.0–0.1)
Basophil %: 0.8 %
Eosinophil #: 0 x10 3/mm (ref 0.0–0.7)
Eosinophil %: 1.2 %
HCT: 25.5 % — ABNORMAL LOW (ref 35.0–47.0)
HGB: 8.2 g/dL — ABNORMAL LOW (ref 12.0–16.0)
LYMPHS ABS: 1.5 x10 3/mm (ref 1.0–3.6)
Lymphocyte %: 58.6 %
MCH: 31.6 pg (ref 26.0–34.0)
MCHC: 32.1 g/dL (ref 32.0–36.0)
MCV: 98 fL (ref 80–100)
MONO ABS: 0.5 x10 3/mm (ref 0.2–0.9)
Monocyte %: 18.4 %
NEUTROS PCT: 21 %
Neutrophil #: 0.5 x10 3/mm — ABNORMAL LOW (ref 1.4–6.5)
Platelet: 176 x10 3/mm (ref 150–440)
RBC: 2.6 10*6/uL — ABNORMAL LOW (ref 3.80–5.20)
RDW: 21 % — ABNORMAL HIGH (ref 11.5–14.5)
WBC: 2.6 x10 3/mm — AB (ref 3.6–11.0)

## 2013-10-24 LAB — BASIC METABOLIC PANEL
Anion Gap: 3 — ABNORMAL LOW (ref 7–16)
BUN: 8 mg/dL (ref 7–18)
CO2: 31 mmol/L (ref 21–32)
Calcium, Total: 8.5 mg/dL (ref 8.5–10.1)
Chloride: 101 mmol/L (ref 98–107)
Creatinine: 0.89 mg/dL (ref 0.60–1.30)
EGFR (African American): >60
EGFR (Non-African Amer.): >60
Glucose: 109 mg/dL — ABNORMAL HIGH (ref 65–99)
Osmolality: 269 (ref 275–301)
Potassium: 4.1 mmol/L (ref 3.5–5.1)
SODIUM: 135 mmol/L — AB (ref 136–145)

## 2013-10-30 LAB — CBC CANCER CENTER
Basophil #: 0 x10 3/mm (ref 0.0–0.1)
Basophil %: 0.1 %
EOS ABS: 0 x10 3/mm (ref 0.0–0.7)
Eosinophil %: 0.1 %
HCT: 29 % — AB (ref 35.0–47.0)
HGB: 9.3 g/dL — AB (ref 12.0–16.0)
LYMPHS ABS: 2.7 x10 3/mm (ref 1.0–3.6)
LYMPHS PCT: 16.6 %
MCH: 31.9 pg (ref 26.0–34.0)
MCHC: 32.2 g/dL (ref 32.0–36.0)
MCV: 99 fL (ref 80–100)
MONO ABS: 1.9 x10 3/mm — AB (ref 0.2–0.9)
Monocyte %: 11.9 %
Neutrophil #: 11.6 x10 3/mm — ABNORMAL HIGH (ref 1.4–6.5)
Neutrophil %: 71.3 %
PLATELETS: 185 x10 3/mm (ref 150–440)
RBC: 2.93 10*6/uL — ABNORMAL LOW (ref 3.80–5.20)
RDW: 20.8 % — ABNORMAL HIGH (ref 11.5–14.5)
WBC: 16.3 x10 3/mm — ABNORMAL HIGH (ref 3.6–11.0)

## 2013-11-07 LAB — BASIC METABOLIC PANEL
ANION GAP: 3 — AB (ref 7–16)
BUN: 5 mg/dL — ABNORMAL LOW (ref 7–18)
CALCIUM: 8.4 mg/dL — AB (ref 8.5–10.1)
CREATININE: 0.88 mg/dL (ref 0.60–1.30)
Chloride: 101 mmol/L (ref 98–107)
Co2: 32 mmol/L (ref 21–32)
EGFR (African American): >60
EGFR (Non-African Amer.): >60
GLUCOSE: 129 mg/dL — AB (ref 65–99)
Osmolality: 271 (ref 275–301)
Potassium: 3.3 mmol/L — ABNORMAL LOW (ref 3.5–5.1)
Sodium: 136 mmol/L (ref 136–145)

## 2013-11-07 LAB — CBC CANCER CENTER
Basophil #: 0 x10 3/mm (ref 0.0–0.1)
Basophil %: 0.3 %
Eosinophil #: 0 x10 3/mm (ref 0.0–0.7)
Eosinophil %: 0.1 %
HCT: 29.8 % — AB (ref 35.0–47.0)
HGB: 9.5 g/dL — AB (ref 12.0–16.0)
Lymphocyte #: 0.3 x10 3/mm — ABNORMAL LOW (ref 1.0–3.6)
Lymphocyte %: 2.9 %
MCH: 32 pg (ref 26.0–34.0)
MCHC: 31.8 g/dL — ABNORMAL LOW (ref 32.0–36.0)
MCV: 101 fL — ABNORMAL HIGH (ref 80–100)
Monocyte #: 1.2 x10 3/mm — ABNORMAL HIGH (ref 0.2–0.9)
Monocyte %: 13.3 %
Neutrophil #: 7.8 x10 3/mm — ABNORMAL HIGH (ref 1.4–6.5)
Neutrophil %: 83.4 %
PLATELETS: 163 x10 3/mm (ref 150–440)
RBC: 2.96 10*6/uL — AB (ref 3.80–5.20)
RDW: 21.7 % — ABNORMAL HIGH (ref 11.5–14.5)
WBC: 9.3 x10 3/mm (ref 3.6–11.0)

## 2013-11-14 ENCOUNTER — Ambulatory Visit: Payer: Self-pay | Admitting: Internal Medicine

## 2013-11-14 LAB — CBC CANCER CENTER
Basophil #: 0 x10 3/mm (ref 0.0–0.1)
Basophil %: 0.5 %
EOS ABS: 0 x10 3/mm (ref 0.0–0.7)
Eosinophil %: 0.7 %
HCT: 31.4 % — ABNORMAL LOW (ref 35.0–47.0)
HGB: 10.2 g/dL — ABNORMAL LOW (ref 12.0–16.0)
LYMPHS PCT: 8.2 %
Lymphocyte #: 0.3 x10 3/mm — ABNORMAL LOW (ref 1.0–3.6)
MCH: 32.6 pg (ref 26.0–34.0)
MCHC: 32.4 g/dL (ref 32.0–36.0)
MCV: 101 fL — AB (ref 80–100)
MONO ABS: 0.6 x10 3/mm (ref 0.2–0.9)
Monocyte %: 14.5 %
Neutrophil #: 3.2 x10 3/mm (ref 1.4–6.5)
Neutrophil %: 76.1 %
PLATELETS: 164 x10 3/mm (ref 150–440)
RBC: 3.11 10*6/uL — AB (ref 3.80–5.20)
RDW: 21 % — ABNORMAL HIGH (ref 11.5–14.5)
WBC: 4.2 x10 3/mm (ref 3.6–11.0)

## 2013-11-21 LAB — CBC CANCER CENTER
BASOS ABS: 0 x10 3/mm (ref 0.0–0.1)
BASOS PCT: 0.4 %
EOS PCT: 1.6 %
Eosinophil #: 0 x10 3/mm (ref 0.0–0.7)
HCT: 30.4 % — ABNORMAL LOW (ref 35.0–47.0)
HGB: 10 g/dL — AB (ref 12.0–16.0)
LYMPHS ABS: 0.3 x10 3/mm — AB (ref 1.0–3.6)
Lymphocyte %: 13.1 %
MCH: 33.4 pg (ref 26.0–34.0)
MCHC: 32.9 g/dL (ref 32.0–36.0)
MCV: 102 fL — ABNORMAL HIGH (ref 80–100)
Monocyte #: 0.5 x10 3/mm (ref 0.2–0.9)
Monocyte %: 21.7 %
Neutrophil #: 1.6 x10 3/mm (ref 1.4–6.5)
Neutrophil %: 63.2 %
Platelet: 155 x10 3/mm (ref 150–440)
RBC: 2.99 10*6/uL — ABNORMAL LOW (ref 3.80–5.20)
RDW: 20.2 % — AB (ref 11.5–14.5)
WBC: 2.5 x10 3/mm — ABNORMAL LOW (ref 3.6–11.0)

## 2013-11-27 LAB — CBC CANCER CENTER
BASOS PCT: 0.9 %
Basophil #: 0 x10 3/mm (ref 0.0–0.1)
EOS ABS: 0 x10 3/mm (ref 0.0–0.7)
EOS PCT: 1.2 %
HCT: 31.5 % — AB (ref 35.0–47.0)
HGB: 10.4 g/dL — ABNORMAL LOW (ref 12.0–16.0)
LYMPHS ABS: 0.5 x10 3/mm — AB (ref 1.0–3.6)
LYMPHS PCT: 20.9 %
MCH: 33.5 pg (ref 26.0–34.0)
MCHC: 33.1 g/dL (ref 32.0–36.0)
MCV: 101 fL — AB (ref 80–100)
MONO ABS: 0.4 x10 3/mm (ref 0.2–0.9)
Monocyte %: 20.2 %
NEUTROS ABS: 1.2 x10 3/mm — AB (ref 1.4–6.5)
Neutrophil %: 56.8 %
PLATELETS: 145 x10 3/mm — AB (ref 150–440)
RBC: 3.11 10*6/uL — ABNORMAL LOW (ref 3.80–5.20)
RDW: 18.6 % — AB (ref 11.5–14.5)
WBC: 2.2 x10 3/mm — AB (ref 3.6–11.0)

## 2013-11-27 LAB — HEPATIC FUNCTION PANEL A (ARMC)
ALK PHOS: 135 U/L — AB
Albumin: 3.1 g/dL — ABNORMAL LOW (ref 3.4–5.0)
Bilirubin, Direct: 0.1 mg/dL (ref 0.00–0.20)
Bilirubin,Total: 0.5 mg/dL (ref 0.2–1.0)
SGOT(AST): 37 U/L (ref 15–37)
SGPT (ALT): 22 U/L
TOTAL PROTEIN: 8.6 g/dL — AB (ref 6.4–8.2)

## 2013-11-27 LAB — BASIC METABOLIC PANEL
Anion Gap: 4 — ABNORMAL LOW (ref 7–16)
BUN: 8 mg/dL (ref 7–18)
CALCIUM: 8.7 mg/dL (ref 8.5–10.1)
CHLORIDE: 99 mmol/L (ref 98–107)
Co2: 32 mmol/L (ref 21–32)
Creatinine: 0.85 mg/dL (ref 0.60–1.30)
EGFR (African American): >60
GLUCOSE: 109 mg/dL — AB (ref 65–99)
OSMOLALITY: 269 (ref 275–301)
Potassium: 3.6 mmol/L (ref 3.5–5.1)
Sodium: 135 mmol/L — ABNORMAL LOW (ref 136–145)

## 2013-12-04 LAB — BASIC METABOLIC PANEL
Anion Gap: 2 — ABNORMAL LOW (ref 7–16)
BUN: 8 mg/dL (ref 7–18)
Calcium, Total: 8.7 mg/dL (ref 8.5–10.1)
Chloride: 101 mmol/L (ref 98–107)
Co2: 33 mmol/L — ABNORMAL HIGH (ref 21–32)
Creatinine: 0.96 mg/dL (ref 0.60–1.30)
EGFR (African American): >60
Glucose: 111 mg/dL — ABNORMAL HIGH (ref 65–99)
OSMOLALITY: 271 (ref 275–301)
POTASSIUM: 3.6 mmol/L (ref 3.5–5.1)
SODIUM: 136 mmol/L (ref 136–145)

## 2013-12-04 LAB — CBC CANCER CENTER
BASOS PCT: 1 %
Basophil #: 0 x10 3/mm (ref 0.0–0.1)
EOS ABS: 0 x10 3/mm (ref 0.0–0.7)
Eosinophil %: 2 %
HCT: 30.9 % — ABNORMAL LOW (ref 35.0–47.0)
HGB: 10.3 g/dL — ABNORMAL LOW (ref 12.0–16.0)
Lymphocyte #: 0.6 x10 3/mm — ABNORMAL LOW (ref 1.0–3.6)
Lymphocyte %: 24.6 %
MCH: 34 pg (ref 26.0–34.0)
MCHC: 33.3 g/dL (ref 32.0–36.0)
MCV: 102 fL — ABNORMAL HIGH (ref 80–100)
MONOS PCT: 14.7 %
Monocyte #: 0.3 x10 3/mm (ref 0.2–0.9)
NEUTROS ABS: 1.3 x10 3/mm — AB (ref 1.4–6.5)
Neutrophil %: 57.7 %
PLATELETS: 152 x10 3/mm (ref 150–440)
RBC: 3.03 10*6/uL — ABNORMAL LOW (ref 3.80–5.20)
RDW: 17.8 % — ABNORMAL HIGH (ref 11.5–14.5)
WBC: 2.3 x10 3/mm — ABNORMAL LOW (ref 3.6–11.0)

## 2013-12-10 LAB — BASIC METABOLIC PANEL
Anion Gap: 5 — ABNORMAL LOW (ref 7–16)
BUN: 11 mg/dL (ref 7–18)
Calcium, Total: 8.7 mg/dL (ref 8.5–10.1)
Chloride: 104 mmol/L (ref 98–107)
Co2: 31 mmol/L (ref 21–32)
Creatinine: 0.95 mg/dL (ref 0.60–1.30)
EGFR (African American): >60
EGFR (Non-African Amer.): >60
GLUCOSE: 128 mg/dL — AB (ref 65–99)
Osmolality: 280 (ref 275–301)
Potassium: 4.6 mmol/L (ref 3.5–5.1)
Sodium: 140 mmol/L (ref 136–145)

## 2013-12-10 LAB — CBC CANCER CENTER
BASOS ABS: 0 x10 3/mm (ref 0.0–0.1)
Basophil %: 0.4 %
Eosinophil #: 0 x10 3/mm (ref 0.0–0.7)
Eosinophil %: 1.7 %
HCT: 31.8 % — AB (ref 35.0–47.0)
HGB: 10.3 g/dL — ABNORMAL LOW (ref 12.0–16.0)
LYMPHS ABS: 0.6 x10 3/mm — AB (ref 1.0–3.6)
Lymphocyte %: 21.6 %
MCH: 33.1 pg (ref 26.0–34.0)
MCHC: 32.3 g/dL (ref 32.0–36.0)
MCV: 102 fL — AB (ref 80–100)
MONO ABS: 0.4 x10 3/mm (ref 0.2–0.9)
Monocyte %: 15.5 %
Neutrophil #: 1.6 x10 3/mm (ref 1.4–6.5)
Neutrophil %: 60.8 %
Platelet: 135 x10 3/mm — ABNORMAL LOW (ref 150–440)
RBC: 3.1 10*6/uL — ABNORMAL LOW (ref 3.80–5.20)
RDW: 16.9 % — ABNORMAL HIGH (ref 11.5–14.5)
WBC: 2.6 x10 3/mm — ABNORMAL LOW (ref 3.6–11.0)

## 2013-12-15 ENCOUNTER — Ambulatory Visit: Payer: Self-pay | Admitting: Internal Medicine

## 2013-12-17 LAB — CBC CANCER CENTER
Basophil #: 0 x10 3/mm (ref 0.0–0.1)
Basophil %: 0.6 %
Eosinophil #: 0 x10 3/mm (ref 0.0–0.7)
Eosinophil %: 1.2 %
HCT: 29.7 % — ABNORMAL LOW (ref 35.0–47.0)
HGB: 9.6 g/dL — ABNORMAL LOW (ref 12.0–16.0)
Lymphocyte #: 0.1 x10 3/mm — ABNORMAL LOW (ref 1.0–3.6)
Lymphocyte %: 4.4 %
MCH: 32.8 pg (ref 26.0–34.0)
MCHC: 32.3 g/dL (ref 32.0–36.0)
MCV: 102 fL — ABNORMAL HIGH (ref 80–100)
MONO ABS: 0.3 x10 3/mm (ref 0.2–0.9)
Monocyte %: 18 %
NEUTROS ABS: 1.4 x10 3/mm (ref 1.4–6.5)
NEUTROS PCT: 75.8 %
PLATELETS: 115 x10 3/mm — AB (ref 150–440)
RBC: 2.92 10*6/uL — AB (ref 3.80–5.20)
RDW: 16.2 % — ABNORMAL HIGH (ref 11.5–14.5)
WBC: 1.8 x10 3/mm — CL (ref 3.6–11.0)

## 2013-12-17 LAB — BASIC METABOLIC PANEL
ANION GAP: 4 — AB (ref 7–16)
BUN: 7 mg/dL (ref 7–18)
CO2: 34 mmol/L — AB (ref 21–32)
CREATININE: 0.84 mg/dL (ref 0.60–1.30)
Calcium, Total: 7.8 mg/dL — ABNORMAL LOW (ref 8.5–10.1)
Chloride: 102 mmol/L (ref 98–107)
EGFR (African American): >60
EGFR (Non-African Amer.): >60
Glucose: 106 mg/dL — ABNORMAL HIGH (ref 65–99)
OSMOLALITY: 278 (ref 275–301)
Potassium: 3.3 mmol/L — ABNORMAL LOW (ref 3.5–5.1)
SODIUM: 140 mmol/L (ref 136–145)

## 2013-12-26 LAB — CBC CANCER CENTER
BASOS ABS: 0 x10 3/mm (ref 0.0–0.1)
BASOS PCT: 0.7 %
Eosinophil #: 0 x10 3/mm (ref 0.0–0.7)
Eosinophil %: 3.2 %
HCT: 31.4 % — AB (ref 35.0–47.0)
HGB: 10.2 g/dL — ABNORMAL LOW (ref 12.0–16.0)
LYMPHS ABS: 0.2 x10 3/mm — AB (ref 1.0–3.6)
LYMPHS PCT: 16.7 %
MCH: 32.6 pg (ref 26.0–34.0)
MCHC: 32.3 g/dL (ref 32.0–36.0)
MCV: 101 fL — AB (ref 80–100)
Monocyte #: 0.3 x10 3/mm (ref 0.2–0.9)
Monocyte %: 18.8 %
NEUTROS PCT: 60.6 %
Neutrophil #: 0.9 x10 3/mm — ABNORMAL LOW (ref 1.4–6.5)
Platelet: 103 x10 3/mm — ABNORMAL LOW (ref 150–440)
RBC: 3.12 10*6/uL — ABNORMAL LOW (ref 3.80–5.20)
RDW: 15 % — ABNORMAL HIGH (ref 11.5–14.5)
WBC: 1.5 x10 3/mm — CL (ref 3.6–11.0)

## 2013-12-30 LAB — PROT IMMUNOELECTROPHORES(ARMC)

## 2013-12-30 LAB — KAPPA/LAMBDA FREE LIGHT CHAINS (ARMC)

## 2014-01-02 LAB — HEPATIC FUNCTION PANEL A (ARMC)
Albumin: 3 g/dL — ABNORMAL LOW (ref 3.4–5.0)
Alkaline Phosphatase: 94 U/L
Bilirubin, Direct: 0.1 mg/dL (ref 0.0–0.2)
Bilirubin,Total: 0.4 mg/dL (ref 0.2–1.0)
SGOT(AST): 40 U/L — ABNORMAL HIGH (ref 15–37)
SGPT (ALT): 22 U/L
Total Protein: 9.2 g/dL — ABNORMAL HIGH (ref 6.4–8.2)

## 2014-01-02 LAB — CBC CANCER CENTER
Basophil #: 0 x10 3/mm (ref 0.0–0.1)
Basophil %: 0.9 %
EOS ABS: 0.1 x10 3/mm (ref 0.0–0.7)
Eosinophil %: 2.6 %
HCT: 29.2 % — ABNORMAL LOW (ref 35.0–47.0)
HGB: 9.6 g/dL — AB (ref 12.0–16.0)
LYMPHS PCT: 21.5 %
Lymphocyte #: 0.5 x10 3/mm — ABNORMAL LOW (ref 1.0–3.6)
MCH: 32.8 pg (ref 26.0–34.0)
MCHC: 33.1 g/dL (ref 32.0–36.0)
MCV: 99 fL (ref 80–100)
Monocyte #: 0.5 x10 3/mm (ref 0.2–0.9)
Monocyte %: 20.9 %
NEUTROS ABS: 1.2 x10 3/mm — AB (ref 1.4–6.5)
Neutrophil %: 54.1 %
PLATELETS: 102 x10 3/mm — AB (ref 150–440)
RBC: 2.94 10*6/uL — AB (ref 3.80–5.20)
RDW: 14.9 % — AB (ref 11.5–14.5)
WBC: 2.2 x10 3/mm — ABNORMAL LOW (ref 3.6–11.0)

## 2014-01-02 LAB — CREATININE, SERUM
CREATININE: 0.96 mg/dL (ref 0.60–1.30)
EGFR (Non-African Amer.): >60

## 2014-01-02 LAB — CALCIUM: CALCIUM: 8.5 mg/dL (ref 8.5–10.1)

## 2014-01-14 ENCOUNTER — Emergency Department: Payer: Self-pay | Admitting: Emergency Medicine

## 2014-01-14 ENCOUNTER — Ambulatory Visit: Payer: Self-pay | Admitting: Internal Medicine

## 2014-01-14 LAB — URINALYSIS, COMPLETE
BILIRUBIN, UR: NEGATIVE
Bacteria: NONE SEEN
Blood: NEGATIVE
Glucose,UR: NEGATIVE mg/dL (ref 0–75)
Ketone: NEGATIVE
Leukocyte Esterase: NEGATIVE
NITRITE: NEGATIVE
PROTEIN: NEGATIVE
Ph: 7 (ref 4.5–8.0)
Specific Gravity: 1.006 (ref 1.003–1.030)
WBC UR: 2 /HPF (ref 0–5)

## 2014-01-14 LAB — CBC CANCER CENTER
BASOS ABS: 0 x10 3/mm (ref 0.0–0.1)
BASOS PCT: 0.5 %
EOS PCT: 3.8 %
Eosinophil #: 0.1 x10 3/mm (ref 0.0–0.7)
HCT: 29.4 % — AB (ref 35.0–47.0)
HGB: 9.6 g/dL — ABNORMAL LOW (ref 12.0–16.0)
LYMPHS ABS: 0.7 x10 3/mm — AB (ref 1.0–3.6)
Lymphocyte %: 30.7 %
MCH: 32.5 pg (ref 26.0–34.0)
MCHC: 32.6 g/dL (ref 32.0–36.0)
MCV: 100 fL (ref 80–100)
MONO ABS: 0.3 x10 3/mm (ref 0.2–0.9)
Monocyte %: 15.2 %
NEUTROS ABS: 1.1 x10 3/mm — AB (ref 1.4–6.5)
NEUTROS PCT: 49.8 %
Platelet: 82 x10 3/mm — ABNORMAL LOW (ref 150–440)
RBC: 2.95 10*6/uL — ABNORMAL LOW (ref 3.80–5.20)
RDW: 14.4 % (ref 11.5–14.5)
WBC: 2.2 x10 3/mm — ABNORMAL LOW (ref 3.6–11.0)

## 2014-01-14 LAB — CBC
HCT: 27.9 % — AB (ref 35.0–47.0)
HGB: 8.9 g/dL — ABNORMAL LOW (ref 12.0–16.0)
MCH: 32.7 pg (ref 26.0–34.0)
MCHC: 31.9 g/dL — AB (ref 32.0–36.0)
MCV: 102 fL — ABNORMAL HIGH (ref 80–100)
PLATELETS: 77 10*3/uL — AB (ref 150–440)
RBC: 2.72 10*6/uL — ABNORMAL LOW (ref 3.80–5.20)
RDW: 14.7 % — ABNORMAL HIGH (ref 11.5–14.5)
WBC: 4.9 10*3/uL (ref 3.6–11.0)

## 2014-01-14 LAB — COMPREHENSIVE METABOLIC PANEL
ALBUMIN: 2.6 g/dL — AB (ref 3.4–5.0)
Alkaline Phosphatase: 87 U/L
Anion Gap: 6 — ABNORMAL LOW (ref 7–16)
BUN: 7 mg/dL (ref 7–18)
Bilirubin,Total: 0.3 mg/dL (ref 0.2–1.0)
CHLORIDE: 105 mmol/L (ref 98–107)
CREATININE: 0.71 mg/dL (ref 0.60–1.30)
Calcium, Total: 7.8 mg/dL — ABNORMAL LOW (ref 8.5–10.1)
Co2: 25 mmol/L (ref 21–32)
Glucose: 143 mg/dL — ABNORMAL HIGH (ref 65–99)
Osmolality: 272 (ref 275–301)
POTASSIUM: 3.3 mmol/L — AB (ref 3.5–5.1)
SGOT(AST): 50 U/L — ABNORMAL HIGH (ref 15–37)
SGPT (ALT): 21 U/L
Sodium: 136 mmol/L (ref 136–145)
Total Protein: 8.9 g/dL — ABNORMAL HIGH (ref 6.4–8.2)

## 2014-01-14 LAB — TROPONIN I: Troponin-I: <0.02 ng/mL

## 2014-01-21 LAB — CBC CANCER CENTER
Basophil #: 0 x10 3/mm (ref 0.0–0.1)
Basophil %: 0.4 %
EOS ABS: 0.1 x10 3/mm (ref 0.0–0.7)
EOS PCT: 2.9 %
HCT: 29.3 % — ABNORMAL LOW (ref 35.0–47.0)
HGB: 9.6 g/dL — AB (ref 12.0–16.0)
Lymphocyte #: 0.6 x10 3/mm — ABNORMAL LOW (ref 1.0–3.6)
Lymphocyte %: 26.8 %
MCH: 32.7 pg (ref 26.0–34.0)
MCHC: 32.8 g/dL (ref 32.0–36.0)
MCV: 99 fL (ref 80–100)
Monocyte #: 0.4 x10 3/mm (ref 0.2–0.9)
Monocyte %: 18.6 %
Neutrophil #: 1.1 x10 3/mm — ABNORMAL LOW (ref 1.4–6.5)
Neutrophil %: 51.3 %
Platelet: 93 x10 3/mm — ABNORMAL LOW (ref 150–440)
RBC: 2.95 10*6/uL — AB (ref 3.80–5.20)
RDW: 14.6 % — ABNORMAL HIGH (ref 11.5–14.5)
WBC: 2.1 x10 3/mm — ABNORMAL LOW (ref 3.6–11.0)

## 2014-01-21 LAB — BASIC METABOLIC PANEL
ANION GAP: 4 — AB (ref 7–16)
BUN: 10 mg/dL (ref 7–18)
CREATININE: 0.98 mg/dL (ref 0.60–1.30)
Calcium, Total: 8.5 mg/dL (ref 8.5–10.1)
Chloride: 99 mmol/L (ref 98–107)
Co2: 33 mmol/L — ABNORMAL HIGH (ref 21–32)
EGFR (Non-African Amer.): 59 — ABNORMAL LOW
GLUCOSE: 129 mg/dL — AB (ref 65–99)
OSMOLALITY: 273 (ref 275–301)
POTASSIUM: 3.2 mmol/L — AB (ref 3.5–5.1)
Sodium: 136 mmol/L (ref 136–145)

## 2014-01-29 LAB — CBC CANCER CENTER
BASOS PCT: 0.4 %
Basophil #: 0 x10 3/mm (ref 0.0–0.1)
Eosinophil #: 0 x10 3/mm (ref 0.0–0.7)
Eosinophil %: 0.4 %
HCT: 30.1 % — AB (ref 35.0–47.0)
HGB: 9.9 g/dL — ABNORMAL LOW (ref 12.0–16.0)
LYMPHS ABS: 1.2 x10 3/mm (ref 1.0–3.6)
Lymphocyte %: 31 %
MCH: 32.4 pg (ref 26.0–34.0)
MCHC: 33 g/dL (ref 32.0–36.0)
MCV: 98 fL (ref 80–100)
Monocyte #: 0.4 x10 3/mm (ref 0.2–0.9)
Monocyte %: 11.6 %
NEUTROS ABS: 2.2 x10 3/mm (ref 1.4–6.5)
NEUTROS PCT: 56.6 %
Platelet: 104 x10 3/mm — ABNORMAL LOW (ref 150–440)
RBC: 3.07 10*6/uL — AB (ref 3.80–5.20)
RDW: 14.9 % — AB (ref 11.5–14.5)
WBC: 3.9 x10 3/mm (ref 3.6–11.0)

## 2014-01-29 LAB — BASIC METABOLIC PANEL
Anion Gap: 9 (ref 7–16)
BUN: 8 mg/dL (ref 7–18)
CHLORIDE: 95 mmol/L — AB (ref 98–107)
CO2: 28 mmol/L (ref 21–32)
Calcium, Total: 8.1 mg/dL — ABNORMAL LOW (ref 8.5–10.1)
Creatinine: 1.13 mg/dL (ref 0.60–1.30)
EGFR (African American): >60
EGFR (Non-African Amer.): 50 — ABNORMAL LOW
Glucose: 122 mg/dL — ABNORMAL HIGH (ref 65–99)
OSMOLALITY: 264 (ref 275–301)
Potassium: 3.5 mmol/L (ref 3.5–5.1)
Sodium: 132 mmol/L — ABNORMAL LOW (ref 136–145)

## 2014-02-05 LAB — BASIC METABOLIC PANEL
Anion Gap: 8 (ref 7–16)
BUN: 8 mg/dL (ref 7–18)
CALCIUM: 8.4 mg/dL — AB (ref 8.5–10.1)
CO2: 33 mmol/L — AB (ref 21–32)
CREATININE: 1.16 mg/dL (ref 0.60–1.30)
Chloride: 94 mmol/L — ABNORMAL LOW (ref 98–107)
GFR CALC AF AMER: 59 — AB
GFR CALC NON AF AMER: 49 — AB
GLUCOSE: 160 mg/dL — AB (ref 65–99)
OSMOLALITY: 272 (ref 275–301)
Potassium: 2.5 mmol/L — CL (ref 3.5–5.1)
Sodium: 135 mmol/L — ABNORMAL LOW (ref 136–145)

## 2014-02-05 LAB — CBC CANCER CENTER
BASOS ABS: 0 x10 3/mm (ref 0.0–0.1)
Basophil %: 0.2 %
EOS PCT: 0.1 %
Eosinophil #: 0 x10 3/mm (ref 0.0–0.7)
HCT: 28.2 % — ABNORMAL LOW (ref 35.0–47.0)
HGB: 9.2 g/dL — ABNORMAL LOW (ref 12.0–16.0)
Lymphocyte #: 0.4 x10 3/mm — ABNORMAL LOW (ref 1.0–3.6)
Lymphocyte %: 7.3 %
MCH: 31.7 pg (ref 26.0–34.0)
MCHC: 32.7 g/dL (ref 32.0–36.0)
MCV: 97 fL (ref 80–100)
MONOS PCT: 10.2 %
Monocyte #: 0.5 x10 3/mm (ref 0.2–0.9)
NEUTROS PCT: 82.2 %
Neutrophil #: 4 x10 3/mm (ref 1.4–6.5)
PLATELETS: 121 x10 3/mm — AB (ref 150–440)
RBC: 2.91 10*6/uL — ABNORMAL LOW (ref 3.80–5.20)
RDW: 14.5 % (ref 11.5–14.5)
WBC: 4.9 x10 3/mm (ref 3.6–11.0)

## 2014-02-12 LAB — BASIC METABOLIC PANEL
Anion Gap: 7 (ref 7–16)
BUN: 9 mg/dL (ref 7–18)
Calcium, Total: 8.8 mg/dL (ref 8.5–10.1)
Chloride: 98 mmol/L (ref 98–107)
Co2: 33 mmol/L — ABNORMAL HIGH (ref 21–32)
Creatinine: 1.24 mg/dL (ref 0.60–1.30)
EGFR (African American): 55 — ABNORMAL LOW
GFR CALC NON AF AMER: 45 — AB
Glucose: 149 mg/dL — ABNORMAL HIGH (ref 65–99)
Osmolality: 277 (ref 275–301)
Potassium: 3.3 mmol/L — ABNORMAL LOW (ref 3.5–5.1)
Sodium: 138 mmol/L (ref 136–145)

## 2014-02-12 LAB — CBC CANCER CENTER
BASOS ABS: 0 x10 3/mm (ref 0.0–0.1)
Basophil %: 0.4 %
EOS ABS: 0 x10 3/mm (ref 0.0–0.7)
Eosinophil %: 1 %
HCT: 29 % — AB (ref 35.0–47.0)
HGB: 9.4 g/dL — ABNORMAL LOW (ref 12.0–16.0)
Lymphocyte #: 0.8 x10 3/mm — ABNORMAL LOW (ref 1.0–3.6)
Lymphocyte %: 22.7 %
MCH: 32.2 pg (ref 26.0–34.0)
MCHC: 32.4 g/dL (ref 32.0–36.0)
MCV: 99 fL (ref 80–100)
Monocyte #: 0.3 x10 3/mm (ref 0.2–0.9)
Monocyte %: 8.4 %
NEUTROS PCT: 67.5 %
Neutrophil #: 2.3 x10 3/mm (ref 1.4–6.5)
Platelet: 108 x10 3/mm — ABNORMAL LOW (ref 150–440)
RBC: 2.91 10*6/uL — ABNORMAL LOW (ref 3.80–5.20)
RDW: 14.8 % — ABNORMAL HIGH (ref 11.5–14.5)
WBC: 3.4 x10 3/mm — AB (ref 3.6–11.0)

## 2014-02-12 LAB — MAGNESIUM: MAGNESIUM: 1.5 mg/dL — AB

## 2014-02-14 ENCOUNTER — Ambulatory Visit: Payer: Self-pay | Admitting: Internal Medicine

## 2014-02-20 DIAGNOSIS — C9002 Multiple myeloma in relapse: Secondary | ICD-10-CM | POA: Diagnosis not present

## 2014-02-20 DIAGNOSIS — Z9221 Personal history of antineoplastic chemotherapy: Secondary | ICD-10-CM | POA: Diagnosis not present

## 2014-02-20 DIAGNOSIS — Z79899 Other long term (current) drug therapy: Secondary | ICD-10-CM | POA: Diagnosis not present

## 2014-02-20 DIAGNOSIS — Z9481 Bone marrow transplant status: Secondary | ICD-10-CM | POA: Diagnosis not present

## 2014-02-20 DIAGNOSIS — R531 Weakness: Secondary | ICD-10-CM | POA: Diagnosis not present

## 2014-02-20 DIAGNOSIS — E119 Type 2 diabetes mellitus without complications: Secondary | ICD-10-CM | POA: Diagnosis not present

## 2014-02-20 DIAGNOSIS — I10 Essential (primary) hypertension: Secondary | ICD-10-CM | POA: Diagnosis not present

## 2014-02-20 DIAGNOSIS — R42 Dizziness and giddiness: Secondary | ICD-10-CM | POA: Diagnosis not present

## 2014-02-20 DIAGNOSIS — Z923 Personal history of irradiation: Secondary | ICD-10-CM | POA: Diagnosis not present

## 2014-02-20 DIAGNOSIS — Z5111 Encounter for antineoplastic chemotherapy: Secondary | ICD-10-CM | POA: Diagnosis not present

## 2014-02-20 DIAGNOSIS — R5383 Other fatigue: Secondary | ICD-10-CM | POA: Diagnosis not present

## 2014-02-20 DIAGNOSIS — E876 Hypokalemia: Secondary | ICD-10-CM | POA: Diagnosis not present

## 2014-02-20 DIAGNOSIS — M25551 Pain in right hip: Secondary | ICD-10-CM | POA: Diagnosis not present

## 2014-02-20 DIAGNOSIS — M79651 Pain in right thigh: Secondary | ICD-10-CM | POA: Diagnosis not present

## 2014-02-20 DIAGNOSIS — R1110 Vomiting, unspecified: Secondary | ICD-10-CM | POA: Diagnosis not present

## 2014-02-20 DIAGNOSIS — Z888 Allergy status to other drugs, medicaments and biological substances status: Secondary | ICD-10-CM | POA: Diagnosis not present

## 2014-02-20 DIAGNOSIS — K219 Gastro-esophageal reflux disease without esophagitis: Secondary | ICD-10-CM | POA: Diagnosis not present

## 2014-02-20 LAB — CBC CANCER CENTER
Basophil #: 0 x10 3/mm (ref 0.0–0.1)
Basophil %: 0.5 %
EOS ABS: 0 x10 3/mm (ref 0.0–0.7)
Eosinophil %: 1.1 %
HCT: 27.9 % — ABNORMAL LOW (ref 35.0–47.0)
HGB: 9.1 g/dL — ABNORMAL LOW (ref 12.0–16.0)
LYMPHS PCT: 35 %
Lymphocyte #: 0.9 x10 3/mm — ABNORMAL LOW (ref 1.0–3.6)
MCH: 32.3 pg (ref 26.0–34.0)
MCHC: 32.5 g/dL (ref 32.0–36.0)
MCV: 99 fL (ref 80–100)
MONO ABS: 0.4 x10 3/mm (ref 0.2–0.9)
Monocyte %: 13.5 %
NEUTROS PCT: 49.9 %
Neutrophil #: 1.3 x10 3/mm — ABNORMAL LOW (ref 1.4–6.5)
PLATELETS: 114 x10 3/mm — AB (ref 150–440)
RBC: 2.81 10*6/uL — AB (ref 3.80–5.20)
RDW: 15.7 % — ABNORMAL HIGH (ref 11.5–14.5)
WBC: 2.6 x10 3/mm — ABNORMAL LOW (ref 3.6–11.0)

## 2014-02-20 LAB — HEPATIC FUNCTION PANEL A (ARMC)
ALBUMIN: 2.5 g/dL — AB (ref 3.4–5.0)
ALK PHOS: 84 U/L
ALT: 21 U/L
AST: 48 U/L — AB (ref 15–37)
Bilirubin, Direct: 0.2 mg/dL (ref 0.0–0.2)
Bilirubin,Total: 0.5 mg/dL (ref 0.2–1.0)
Total Protein: 12.4 g/dL — ABNORMAL HIGH (ref 6.4–8.2)

## 2014-02-20 LAB — POTASSIUM: POTASSIUM: 3.1 mmol/L — AB (ref 3.5–5.1)

## 2014-02-20 LAB — CREATININE, SERUM
Creatinine: 1.48 mg/dL — ABNORMAL HIGH (ref 0.60–1.30)
EGFR (African American): 45 — ABNORMAL LOW
GFR CALC NON AF AMER: 37 — AB

## 2014-02-20 LAB — MAGNESIUM: Magnesium: 2 mg/dL

## 2014-02-20 LAB — CALCIUM: Calcium, Total: 8.9 mg/dL (ref 8.5–10.1)

## 2014-02-26 DIAGNOSIS — Z79899 Other long term (current) drug therapy: Secondary | ICD-10-CM | POA: Diagnosis not present

## 2014-02-26 DIAGNOSIS — R5383 Other fatigue: Secondary | ICD-10-CM | POA: Diagnosis not present

## 2014-02-26 DIAGNOSIS — M25551 Pain in right hip: Secondary | ICD-10-CM | POA: Diagnosis not present

## 2014-02-26 DIAGNOSIS — Z9221 Personal history of antineoplastic chemotherapy: Secondary | ICD-10-CM | POA: Diagnosis not present

## 2014-02-26 DIAGNOSIS — Z888 Allergy status to other drugs, medicaments and biological substances status: Secondary | ICD-10-CM | POA: Diagnosis not present

## 2014-02-26 DIAGNOSIS — E876 Hypokalemia: Secondary | ICD-10-CM | POA: Diagnosis not present

## 2014-02-26 DIAGNOSIS — R531 Weakness: Secondary | ICD-10-CM | POA: Diagnosis not present

## 2014-02-26 DIAGNOSIS — I10 Essential (primary) hypertension: Secondary | ICD-10-CM | POA: Diagnosis not present

## 2014-02-26 DIAGNOSIS — E119 Type 2 diabetes mellitus without complications: Secondary | ICD-10-CM | POA: Diagnosis not present

## 2014-02-26 DIAGNOSIS — R42 Dizziness and giddiness: Secondary | ICD-10-CM | POA: Diagnosis not present

## 2014-02-26 DIAGNOSIS — C9002 Multiple myeloma in relapse: Secondary | ICD-10-CM | POA: Diagnosis not present

## 2014-02-26 DIAGNOSIS — Z5111 Encounter for antineoplastic chemotherapy: Secondary | ICD-10-CM | POA: Diagnosis not present

## 2014-02-26 DIAGNOSIS — Z923 Personal history of irradiation: Secondary | ICD-10-CM | POA: Diagnosis not present

## 2014-02-26 DIAGNOSIS — K219 Gastro-esophageal reflux disease without esophagitis: Secondary | ICD-10-CM | POA: Diagnosis not present

## 2014-02-26 DIAGNOSIS — Z9481 Bone marrow transplant status: Secondary | ICD-10-CM | POA: Diagnosis not present

## 2014-02-26 DIAGNOSIS — R1110 Vomiting, unspecified: Secondary | ICD-10-CM | POA: Diagnosis not present

## 2014-02-26 DIAGNOSIS — M79651 Pain in right thigh: Secondary | ICD-10-CM | POA: Diagnosis not present

## 2014-02-26 LAB — COMPREHENSIVE METABOLIC PANEL
ALK PHOS: 104 U/L
ALT: 24 U/L
Albumin: 2.4 g/dL — ABNORMAL LOW (ref 3.4–5.0)
Anion Gap: 5 — ABNORMAL LOW (ref 7–16)
BILIRUBIN TOTAL: 0.3 mg/dL (ref 0.2–1.0)
BUN: 9 mg/dL (ref 7–18)
CO2: 29 mmol/L (ref 21–32)
Calcium, Total: 8.8 mg/dL (ref 8.5–10.1)
Chloride: 99 mmol/L (ref 98–107)
Creatinine: 1.23 mg/dL (ref 0.60–1.30)
EGFR (African American): 55 — ABNORMAL LOW
EGFR (Non-African Amer.): 46 — ABNORMAL LOW
GLUCOSE: 126 mg/dL — AB (ref 65–99)
Osmolality: 267 (ref 275–301)
Potassium: 3.6 mmol/L (ref 3.5–5.1)
SGOT(AST): 57 U/L — ABNORMAL HIGH (ref 15–37)
Sodium: 133 mmol/L — ABNORMAL LOW (ref 136–145)
Total Protein: 11.9 g/dL — ABNORMAL HIGH (ref 6.4–8.2)

## 2014-02-26 LAB — CBC CANCER CENTER
BASOS ABS: 0 x10 3/mm (ref 0.0–0.1)
Basophil %: 0.5 %
Eosinophil #: 0 x10 3/mm (ref 0.0–0.7)
Eosinophil %: 1.7 %
HCT: 25.6 % — ABNORMAL LOW (ref 35.0–47.0)
HGB: 8.5 g/dL — ABNORMAL LOW (ref 12.0–16.0)
Lymphocyte #: 0.6 x10 3/mm — ABNORMAL LOW (ref 1.0–3.6)
Lymphocyte %: 24.5 %
MCH: 32.4 pg (ref 26.0–34.0)
MCHC: 33 g/dL (ref 32.0–36.0)
MCV: 98 fL (ref 80–100)
MONO ABS: 0.4 x10 3/mm (ref 0.2–0.9)
MONOS PCT: 14.8 %
Neutrophil #: 1.5 x10 3/mm (ref 1.4–6.5)
Neutrophil %: 58.5 %
PLATELETS: 119 x10 3/mm — AB (ref 150–440)
RBC: 2.61 10*6/uL — ABNORMAL LOW (ref 3.80–5.20)
RDW: 16.9 % — AB (ref 11.5–14.5)
WBC: 2.5 x10 3/mm — ABNORMAL LOW (ref 3.6–11.0)

## 2014-03-05 DIAGNOSIS — R1110 Vomiting, unspecified: Secondary | ICD-10-CM | POA: Diagnosis not present

## 2014-03-05 DIAGNOSIS — I10 Essential (primary) hypertension: Secondary | ICD-10-CM | POA: Diagnosis not present

## 2014-03-05 DIAGNOSIS — R42 Dizziness and giddiness: Secondary | ICD-10-CM | POA: Diagnosis not present

## 2014-03-05 DIAGNOSIS — Z79899 Other long term (current) drug therapy: Secondary | ICD-10-CM | POA: Diagnosis not present

## 2014-03-05 DIAGNOSIS — Z923 Personal history of irradiation: Secondary | ICD-10-CM | POA: Diagnosis not present

## 2014-03-05 DIAGNOSIS — E876 Hypokalemia: Secondary | ICD-10-CM | POA: Diagnosis not present

## 2014-03-05 DIAGNOSIS — R5383 Other fatigue: Secondary | ICD-10-CM | POA: Diagnosis not present

## 2014-03-05 DIAGNOSIS — K219 Gastro-esophageal reflux disease without esophagitis: Secondary | ICD-10-CM | POA: Diagnosis not present

## 2014-03-05 DIAGNOSIS — Z888 Allergy status to other drugs, medicaments and biological substances status: Secondary | ICD-10-CM | POA: Diagnosis not present

## 2014-03-05 DIAGNOSIS — Z9221 Personal history of antineoplastic chemotherapy: Secondary | ICD-10-CM | POA: Diagnosis not present

## 2014-03-05 DIAGNOSIS — Z9481 Bone marrow transplant status: Secondary | ICD-10-CM | POA: Diagnosis not present

## 2014-03-05 DIAGNOSIS — E119 Type 2 diabetes mellitus without complications: Secondary | ICD-10-CM | POA: Diagnosis not present

## 2014-03-05 DIAGNOSIS — R531 Weakness: Secondary | ICD-10-CM | POA: Diagnosis not present

## 2014-03-05 DIAGNOSIS — C9002 Multiple myeloma in relapse: Secondary | ICD-10-CM | POA: Diagnosis not present

## 2014-03-05 DIAGNOSIS — Z5111 Encounter for antineoplastic chemotherapy: Secondary | ICD-10-CM | POA: Diagnosis not present

## 2014-03-05 DIAGNOSIS — M25551 Pain in right hip: Secondary | ICD-10-CM | POA: Diagnosis not present

## 2014-03-05 DIAGNOSIS — M79651 Pain in right thigh: Secondary | ICD-10-CM | POA: Diagnosis not present

## 2014-03-05 LAB — COMPREHENSIVE METABOLIC PANEL
ALT: 25 U/L
ANION GAP: 5 — AB (ref 7–16)
Albumin: 2.4 g/dL — ABNORMAL LOW (ref 3.4–5.0)
Alkaline Phosphatase: 106 U/L
BUN: 11 mg/dL (ref 7–18)
Bilirubin,Total: 0.4 mg/dL (ref 0.2–1.0)
CHLORIDE: 96 mmol/L — AB (ref 98–107)
Calcium, Total: 9 mg/dL (ref 8.5–10.1)
Co2: 31 mmol/L (ref 21–32)
Creatinine: 1.33 mg/dL — ABNORMAL HIGH (ref 0.60–1.30)
EGFR (African American): 51 — ABNORMAL LOW
GFR CALC NON AF AMER: 42 — AB
Glucose: 140 mg/dL — ABNORMAL HIGH (ref 65–99)
Osmolality: 266 (ref 275–301)
POTASSIUM: 3.9 mmol/L (ref 3.5–5.1)
SGOT(AST): 58 U/L — ABNORMAL HIGH (ref 15–37)
Sodium: 132 mmol/L — ABNORMAL LOW (ref 136–145)
Total Protein: 12.7 g/dL — ABNORMAL HIGH (ref 6.4–8.2)

## 2014-03-05 LAB — CBC CANCER CENTER
BASOS PCT: 0.5 %
Basophil #: 0 x10 3/mm (ref 0.0–0.1)
Eosinophil #: 0 x10 3/mm (ref 0.0–0.7)
Eosinophil %: 0.7 %
HCT: 25.5 % — ABNORMAL LOW (ref 35.0–47.0)
HGB: 8.3 g/dL — ABNORMAL LOW (ref 12.0–16.0)
LYMPHS ABS: 0.5 x10 3/mm — AB (ref 1.0–3.6)
Lymphocyte %: 22.9 %
MCH: 32 pg (ref 26.0–34.0)
MCHC: 32.7 g/dL (ref 32.0–36.0)
MCV: 98 fL (ref 80–100)
Monocyte #: 0.4 x10 3/mm (ref 0.2–0.9)
Monocyte %: 16.2 %
Neutrophil #: 1.4 x10 3/mm (ref 1.4–6.5)
Neutrophil %: 59.7 %
PLATELETS: 108 x10 3/mm — AB (ref 150–440)
RBC: 2.6 10*6/uL — ABNORMAL LOW (ref 3.80–5.20)
RDW: 17.6 % — ABNORMAL HIGH (ref 11.5–14.5)
WBC: 2.4 x10 3/mm — ABNORMAL LOW (ref 3.6–11.0)

## 2014-03-12 DIAGNOSIS — R531 Weakness: Secondary | ICD-10-CM | POA: Diagnosis not present

## 2014-03-12 DIAGNOSIS — R42 Dizziness and giddiness: Secondary | ICD-10-CM | POA: Diagnosis not present

## 2014-03-12 DIAGNOSIS — C9002 Multiple myeloma in relapse: Secondary | ICD-10-CM | POA: Diagnosis not present

## 2014-03-12 DIAGNOSIS — Z79899 Other long term (current) drug therapy: Secondary | ICD-10-CM | POA: Diagnosis not present

## 2014-03-12 DIAGNOSIS — Z5111 Encounter for antineoplastic chemotherapy: Secondary | ICD-10-CM | POA: Diagnosis not present

## 2014-03-12 DIAGNOSIS — E119 Type 2 diabetes mellitus without complications: Secondary | ICD-10-CM | POA: Diagnosis not present

## 2014-03-12 DIAGNOSIS — Z888 Allergy status to other drugs, medicaments and biological substances status: Secondary | ICD-10-CM | POA: Diagnosis not present

## 2014-03-12 DIAGNOSIS — M79651 Pain in right thigh: Secondary | ICD-10-CM | POA: Diagnosis not present

## 2014-03-12 DIAGNOSIS — Z923 Personal history of irradiation: Secondary | ICD-10-CM | POA: Diagnosis not present

## 2014-03-12 DIAGNOSIS — Z9221 Personal history of antineoplastic chemotherapy: Secondary | ICD-10-CM | POA: Diagnosis not present

## 2014-03-12 DIAGNOSIS — Z9481 Bone marrow transplant status: Secondary | ICD-10-CM | POA: Diagnosis not present

## 2014-03-12 DIAGNOSIS — K219 Gastro-esophageal reflux disease without esophagitis: Secondary | ICD-10-CM | POA: Diagnosis not present

## 2014-03-12 DIAGNOSIS — I10 Essential (primary) hypertension: Secondary | ICD-10-CM | POA: Diagnosis not present

## 2014-03-12 DIAGNOSIS — R1110 Vomiting, unspecified: Secondary | ICD-10-CM | POA: Diagnosis not present

## 2014-03-12 DIAGNOSIS — M25551 Pain in right hip: Secondary | ICD-10-CM | POA: Diagnosis not present

## 2014-03-12 DIAGNOSIS — R5383 Other fatigue: Secondary | ICD-10-CM | POA: Diagnosis not present

## 2014-03-12 DIAGNOSIS — E876 Hypokalemia: Secondary | ICD-10-CM | POA: Diagnosis not present

## 2014-03-12 LAB — BASIC METABOLIC PANEL
Anion Gap: 7 (ref 7–16)
BUN: 12 mg/dL (ref 7–18)
CREATININE: 1.53 mg/dL — AB (ref 0.60–1.30)
Calcium, Total: 8.3 mg/dL — ABNORMAL LOW (ref 8.5–10.1)
Chloride: 100 mmol/L (ref 98–107)
Co2: 29 mmol/L (ref 21–32)
EGFR (African American): 43 — ABNORMAL LOW
EGFR (Non-African Amer.): 36 — ABNORMAL LOW
Glucose: 134 mg/dL — ABNORMAL HIGH (ref 65–99)
Osmolality: 274 (ref 275–301)
Potassium: 3.7 mmol/L (ref 3.5–5.1)
SODIUM: 136 mmol/L (ref 136–145)

## 2014-03-12 LAB — CBC CANCER CENTER
BASOS PCT: 0.5 %
Basophil #: 0 x10 3/mm (ref 0.0–0.1)
Eosinophil #: 0 x10 3/mm (ref 0.0–0.7)
Eosinophil %: 1.4 %
HCT: 23.2 % — AB (ref 35.0–47.0)
HGB: 7.6 g/dL — AB (ref 12.0–16.0)
Lymphocyte #: 0.4 x10 3/mm — ABNORMAL LOW (ref 1.0–3.6)
Lymphocyte %: 19.2 %
MCH: 32.5 pg (ref 26.0–34.0)
MCHC: 32.9 g/dL (ref 32.0–36.0)
MCV: 99 fL (ref 80–100)
MONOS PCT: 15.2 %
Monocyte #: 0.3 x10 3/mm (ref 0.2–0.9)
NEUTROS ABS: 1.4 x10 3/mm (ref 1.4–6.5)
Neutrophil %: 63.7 %
PLATELETS: 83 x10 3/mm — AB (ref 150–440)
RBC: 2.35 10*6/uL — AB (ref 3.80–5.20)
RDW: 18.4 % — ABNORMAL HIGH (ref 11.5–14.5)
WBC: 2.1 x10 3/mm — AB (ref 3.6–11.0)

## 2014-03-17 ENCOUNTER — Ambulatory Visit: Payer: Self-pay | Admitting: Internal Medicine

## 2014-03-19 DIAGNOSIS — M5030 Other cervical disc degeneration, unspecified cervical region: Secondary | ICD-10-CM | POA: Diagnosis not present

## 2014-03-19 DIAGNOSIS — Z90710 Acquired absence of both cervix and uterus: Secondary | ICD-10-CM | POA: Diagnosis not present

## 2014-03-19 DIAGNOSIS — Z5111 Encounter for antineoplastic chemotherapy: Secondary | ICD-10-CM | POA: Diagnosis not present

## 2014-03-19 DIAGNOSIS — Z510 Encounter for antineoplastic radiation therapy: Secondary | ICD-10-CM | POA: Diagnosis not present

## 2014-03-19 DIAGNOSIS — G8929 Other chronic pain: Secondary | ICD-10-CM | POA: Diagnosis not present

## 2014-03-19 DIAGNOSIS — R531 Weakness: Secondary | ICD-10-CM | POA: Diagnosis not present

## 2014-03-19 DIAGNOSIS — R42 Dizziness and giddiness: Secondary | ICD-10-CM | POA: Diagnosis not present

## 2014-03-19 DIAGNOSIS — Z9481 Bone marrow transplant status: Secondary | ICD-10-CM | POA: Diagnosis not present

## 2014-03-19 DIAGNOSIS — K219 Gastro-esophageal reflux disease without esophagitis: Secondary | ICD-10-CM | POA: Diagnosis not present

## 2014-03-19 DIAGNOSIS — I10 Essential (primary) hypertension: Secondary | ICD-10-CM | POA: Diagnosis not present

## 2014-03-19 DIAGNOSIS — Z79899 Other long term (current) drug therapy: Secondary | ICD-10-CM | POA: Diagnosis not present

## 2014-03-19 DIAGNOSIS — R5383 Other fatigue: Secondary | ICD-10-CM | POA: Diagnosis not present

## 2014-03-19 DIAGNOSIS — E876 Hypokalemia: Secondary | ICD-10-CM | POA: Diagnosis not present

## 2014-03-19 DIAGNOSIS — E119 Type 2 diabetes mellitus without complications: Secondary | ICD-10-CM | POA: Diagnosis not present

## 2014-03-19 DIAGNOSIS — C9002 Multiple myeloma in relapse: Secondary | ICD-10-CM | POA: Diagnosis not present

## 2014-03-19 DIAGNOSIS — K529 Noninfective gastroenteritis and colitis, unspecified: Secondary | ICD-10-CM | POA: Diagnosis not present

## 2014-03-19 LAB — CBC CANCER CENTER
BASOS ABS: 0 x10 3/mm (ref 0.0–0.1)
Basophil %: 0.3 %
EOS ABS: 0 x10 3/mm (ref 0.0–0.7)
Eosinophil %: 1.5 %
HCT: 22.8 % — ABNORMAL LOW (ref 35.0–47.0)
HGB: 7.6 g/dL — AB (ref 12.0–16.0)
LYMPHS PCT: 24.5 %
Lymphocyte #: 0.5 x10 3/mm — ABNORMAL LOW (ref 1.0–3.6)
MCH: 32.6 pg (ref 26.0–34.0)
MCHC: 33.3 g/dL (ref 32.0–36.0)
MCV: 98 fL (ref 80–100)
MONO ABS: 0.3 x10 3/mm (ref 0.2–0.9)
Monocyte %: 12 %
NEUTROS PCT: 61.7 %
Neutrophil #: 1.4 x10 3/mm (ref 1.4–6.5)
PLATELETS: 84 x10 3/mm — AB (ref 150–440)
RBC: 2.33 10*6/uL — ABNORMAL LOW (ref 3.80–5.20)
RDW: 18 % — ABNORMAL HIGH (ref 11.5–14.5)
WBC: 2.2 x10 3/mm — ABNORMAL LOW (ref 3.6–11.0)

## 2014-03-19 LAB — BASIC METABOLIC PANEL
ANION GAP: 3 — AB (ref 7–16)
BUN: 11 mg/dL (ref 7–18)
CALCIUM: 8.3 mg/dL — AB (ref 8.5–10.1)
Chloride: 99 mmol/L (ref 98–107)
Co2: 34 mmol/L — ABNORMAL HIGH (ref 21–32)
Creatinine: 1.29 mg/dL (ref 0.60–1.30)
EGFR (African American): 52 — ABNORMAL LOW
GFR CALC NON AF AMER: 43 — AB
GLUCOSE: 114 mg/dL — AB (ref 65–99)
OSMOLALITY: 272 (ref 275–301)
Potassium: 3.5 mmol/L (ref 3.5–5.1)
Sodium: 136 mmol/L (ref 136–145)

## 2014-03-20 ENCOUNTER — Ambulatory Visit: Payer: Self-pay

## 2014-03-20 ENCOUNTER — Inpatient Hospital Stay: Payer: Self-pay | Admitting: Internal Medicine

## 2014-03-20 DIAGNOSIS — G4700 Insomnia, unspecified: Secondary | ICD-10-CM | POA: Diagnosis not present

## 2014-03-20 DIAGNOSIS — K219 Gastro-esophageal reflux disease without esophagitis: Secondary | ICD-10-CM | POA: Diagnosis not present

## 2014-03-20 DIAGNOSIS — I5032 Chronic diastolic (congestive) heart failure: Secondary | ICD-10-CM | POA: Diagnosis not present

## 2014-03-20 DIAGNOSIS — S42291A Other displaced fracture of upper end of right humerus, initial encounter for closed fracture: Secondary | ICD-10-CM | POA: Diagnosis not present

## 2014-03-20 DIAGNOSIS — M84521A Pathological fracture in neoplastic disease, right humerus, initial encounter for fracture: Secondary | ICD-10-CM | POA: Diagnosis not present

## 2014-03-20 DIAGNOSIS — Z79899 Other long term (current) drug therapy: Secondary | ICD-10-CM | POA: Diagnosis not present

## 2014-03-20 DIAGNOSIS — E119 Type 2 diabetes mellitus without complications: Secondary | ICD-10-CM | POA: Diagnosis not present

## 2014-03-20 DIAGNOSIS — D61818 Other pancytopenia: Secondary | ICD-10-CM | POA: Diagnosis not present

## 2014-03-20 DIAGNOSIS — D649 Anemia, unspecified: Secondary | ICD-10-CM | POA: Diagnosis not present

## 2014-03-20 DIAGNOSIS — I10 Essential (primary) hypertension: Secondary | ICD-10-CM | POA: Diagnosis not present

## 2014-03-20 DIAGNOSIS — Z9481 Bone marrow transplant status: Secondary | ICD-10-CM | POA: Diagnosis not present

## 2014-03-20 DIAGNOSIS — D489 Neoplasm of uncertain behavior, unspecified: Secondary | ICD-10-CM | POA: Diagnosis not present

## 2014-03-20 DIAGNOSIS — D696 Thrombocytopenia, unspecified: Secondary | ICD-10-CM | POA: Diagnosis not present

## 2014-03-20 DIAGNOSIS — Z833 Family history of diabetes mellitus: Secondary | ICD-10-CM | POA: Diagnosis not present

## 2014-03-20 DIAGNOSIS — C9000 Multiple myeloma not having achieved remission: Secondary | ICD-10-CM | POA: Diagnosis not present

## 2014-03-20 DIAGNOSIS — Z803 Family history of malignant neoplasm of breast: Secondary | ICD-10-CM | POA: Diagnosis not present

## 2014-03-20 DIAGNOSIS — S42292A Other displaced fracture of upper end of left humerus, initial encounter for closed fracture: Secondary | ICD-10-CM | POA: Diagnosis not present

## 2014-03-20 DIAGNOSIS — G894 Chronic pain syndrome: Secondary | ICD-10-CM | POA: Diagnosis not present

## 2014-03-20 DIAGNOSIS — Z0389 Encounter for observation for other suspected diseases and conditions ruled out: Secondary | ICD-10-CM | POA: Diagnosis not present

## 2014-03-20 DIAGNOSIS — M84421A Pathological fracture, right humerus, initial encounter for fracture: Secondary | ICD-10-CM | POA: Diagnosis not present

## 2014-03-20 LAB — COMPREHENSIVE METABOLIC PANEL
ALBUMIN: 2.3 g/dL — AB (ref 3.4–5.0)
ANION GAP: 3 — AB (ref 7–16)
Alkaline Phosphatase: 107 U/L (ref 46–116)
BUN: 11 mg/dL (ref 7–18)
Bilirubin,Total: 0.4 mg/dL (ref 0.2–1.0)
CREATININE: 1.23 mg/dL (ref 0.60–1.30)
Calcium, Total: 8.7 mg/dL (ref 8.5–10.1)
Chloride: 101 mmol/L (ref 98–107)
Co2: 32 mmol/L (ref 21–32)
EGFR (Non-African Amer.): 46 — ABNORMAL LOW
GFR CALC AF AMER: 55 — AB
GLUCOSE: 109 mg/dL — AB (ref 65–99)
OSMOLALITY: 272 (ref 275–301)
POTASSIUM: 3.3 mmol/L — AB (ref 3.5–5.1)
SGOT(AST): 77 U/L — ABNORMAL HIGH (ref 15–37)
SGPT (ALT): 34 U/L (ref 14–63)
SODIUM: 136 mmol/L (ref 136–145)
TOTAL PROTEIN: 12.1 g/dL — AB (ref 6.4–8.2)

## 2014-03-20 LAB — URINALYSIS, COMPLETE
BILIRUBIN, UR: NEGATIVE
BLOOD: NEGATIVE
Bacteria: NONE SEEN
Glucose,UR: NEGATIVE mg/dL (ref 0–75)
Ketone: NEGATIVE
Leukocyte Esterase: NEGATIVE
Nitrite: NEGATIVE
Ph: 7 (ref 4.5–8.0)
Protein: 30
RBC,UR: 1 /HPF (ref 0–5)
Specific Gravity: 1.009 (ref 1.003–1.030)
WBC UR: 1 /HPF (ref 0–5)

## 2014-03-20 LAB — CBC WITH DIFFERENTIAL/PLATELET
BASOS PCT: 0.1 %
Basophil #: 0 10*3/uL (ref 0.0–0.1)
Eosinophil #: 0 10*3/uL (ref 0.0–0.7)
Eosinophil %: 0.1 %
HCT: 21.8 % — ABNORMAL LOW (ref 35.0–47.0)
HGB: 7.1 g/dL — AB (ref 12.0–16.0)
LYMPHS PCT: 14.2 %
Lymphocyte #: 0.5 10*3/uL — ABNORMAL LOW (ref 1.0–3.6)
MCH: 32.2 pg (ref 26.0–34.0)
MCHC: 32.7 g/dL (ref 32.0–36.0)
MCV: 98 fL (ref 80–100)
MONO ABS: 0.4 x10 3/mm (ref 0.2–0.9)
Monocyte %: 12.3 %
NEUTROS PCT: 73.3 %
Neutrophil #: 2.7 10*3/uL (ref 1.4–6.5)
PLATELETS: 88 10*3/uL — AB (ref 150–440)
RBC: 2.21 10*6/uL — ABNORMAL LOW (ref 3.80–5.20)
RDW: 18.6 % — ABNORMAL HIGH (ref 11.5–14.5)
WBC: 3.6 10*3/uL (ref 3.6–11.0)

## 2014-03-20 LAB — PROTIME-INR
INR: 1.2
PROTHROMBIN TIME: 14.9 s

## 2014-03-20 LAB — APTT: Activated PTT: 29.7 secs (ref 23.6–35.9)

## 2014-03-20 LAB — TROPONIN I

## 2014-03-21 LAB — CBC WITH DIFFERENTIAL/PLATELET
BASOS PCT: 0.2 %
Basophil #: 0 10*3/uL (ref 0.0–0.1)
EOS PCT: 0.3 %
Eosinophil #: 0 10*3/uL (ref 0.0–0.7)
HCT: 27.2 % — ABNORMAL LOW (ref 35.0–47.0)
HGB: 9 g/dL — AB (ref 12.0–16.0)
LYMPHS PCT: 10.9 %
Lymphocyte #: 0.4 10*3/uL — ABNORMAL LOW (ref 1.0–3.6)
MCH: 31.5 pg (ref 26.0–34.0)
MCHC: 33.2 g/dL (ref 32.0–36.0)
MCV: 95 fL (ref 80–100)
Monocyte #: 0.4 x10 3/mm (ref 0.2–0.9)
Monocyte %: 12.1 %
NEUTROS PCT: 76.5 %
Neutrophil #: 2.8 10*3/uL (ref 1.4–6.5)
Platelet: 75 10*3/uL — ABNORMAL LOW (ref 150–440)
RBC: 2.86 10*6/uL — AB (ref 3.80–5.20)
RDW: 17.6 % — ABNORMAL HIGH (ref 11.5–14.5)
WBC: 3.6 10*3/uL (ref 3.6–11.0)

## 2014-03-21 LAB — BASIC METABOLIC PANEL
Anion Gap: 5 — ABNORMAL LOW (ref 7–16)
BUN: 9 mg/dL (ref 7–18)
CHLORIDE: 101 mmol/L (ref 98–107)
CREATININE: 1.1 mg/dL (ref 0.60–1.30)
Calcium, Total: 8.6 mg/dL (ref 8.5–10.1)
Co2: 30 mmol/L (ref 21–32)
EGFR (African American): >60
EGFR (Non-African Amer.): 52 — ABNORMAL LOW
Glucose: 87 mg/dL (ref 65–99)
Osmolality: 270 (ref 275–301)
Potassium: 3 mmol/L — ABNORMAL LOW (ref 3.5–5.1)
Sodium: 136 mmol/L (ref 136–145)

## 2014-03-26 DIAGNOSIS — K219 Gastro-esophageal reflux disease without esophagitis: Secondary | ICD-10-CM | POA: Diagnosis not present

## 2014-03-26 DIAGNOSIS — E119 Type 2 diabetes mellitus without complications: Secondary | ICD-10-CM | POA: Diagnosis not present

## 2014-03-26 DIAGNOSIS — C9002 Multiple myeloma in relapse: Secondary | ICD-10-CM | POA: Diagnosis not present

## 2014-03-26 DIAGNOSIS — I10 Essential (primary) hypertension: Secondary | ICD-10-CM | POA: Diagnosis not present

## 2014-03-26 DIAGNOSIS — R42 Dizziness and giddiness: Secondary | ICD-10-CM | POA: Diagnosis not present

## 2014-03-26 DIAGNOSIS — Z90710 Acquired absence of both cervix and uterus: Secondary | ICD-10-CM | POA: Diagnosis not present

## 2014-03-26 DIAGNOSIS — E876 Hypokalemia: Secondary | ICD-10-CM | POA: Diagnosis not present

## 2014-03-26 DIAGNOSIS — G8929 Other chronic pain: Secondary | ICD-10-CM | POA: Diagnosis not present

## 2014-03-26 DIAGNOSIS — Z79899 Other long term (current) drug therapy: Secondary | ICD-10-CM | POA: Diagnosis not present

## 2014-03-26 DIAGNOSIS — R5383 Other fatigue: Secondary | ICD-10-CM | POA: Diagnosis not present

## 2014-03-26 DIAGNOSIS — Z510 Encounter for antineoplastic radiation therapy: Secondary | ICD-10-CM | POA: Diagnosis not present

## 2014-03-26 DIAGNOSIS — M5030 Other cervical disc degeneration, unspecified cervical region: Secondary | ICD-10-CM | POA: Diagnosis not present

## 2014-03-26 DIAGNOSIS — Z9481 Bone marrow transplant status: Secondary | ICD-10-CM | POA: Diagnosis not present

## 2014-03-26 DIAGNOSIS — R531 Weakness: Secondary | ICD-10-CM | POA: Diagnosis not present

## 2014-03-26 DIAGNOSIS — K529 Noninfective gastroenteritis and colitis, unspecified: Secondary | ICD-10-CM | POA: Diagnosis not present

## 2014-03-26 DIAGNOSIS — Z5111 Encounter for antineoplastic chemotherapy: Secondary | ICD-10-CM | POA: Diagnosis not present

## 2014-04-02 DIAGNOSIS — M5030 Other cervical disc degeneration, unspecified cervical region: Secondary | ICD-10-CM | POA: Diagnosis not present

## 2014-04-02 DIAGNOSIS — E876 Hypokalemia: Secondary | ICD-10-CM | POA: Diagnosis not present

## 2014-04-02 DIAGNOSIS — Z79899 Other long term (current) drug therapy: Secondary | ICD-10-CM | POA: Diagnosis not present

## 2014-04-02 DIAGNOSIS — I10 Essential (primary) hypertension: Secondary | ICD-10-CM | POA: Diagnosis not present

## 2014-04-02 DIAGNOSIS — R5383 Other fatigue: Secondary | ICD-10-CM | POA: Diagnosis not present

## 2014-04-02 DIAGNOSIS — E119 Type 2 diabetes mellitus without complications: Secondary | ICD-10-CM | POA: Diagnosis not present

## 2014-04-02 DIAGNOSIS — R42 Dizziness and giddiness: Secondary | ICD-10-CM | POA: Diagnosis not present

## 2014-04-02 DIAGNOSIS — G8929 Other chronic pain: Secondary | ICD-10-CM | POA: Diagnosis not present

## 2014-04-02 DIAGNOSIS — C9002 Multiple myeloma in relapse: Secondary | ICD-10-CM | POA: Diagnosis not present

## 2014-04-02 DIAGNOSIS — K529 Noninfective gastroenteritis and colitis, unspecified: Secondary | ICD-10-CM | POA: Diagnosis not present

## 2014-04-02 DIAGNOSIS — Z510 Encounter for antineoplastic radiation therapy: Secondary | ICD-10-CM | POA: Diagnosis not present

## 2014-04-02 DIAGNOSIS — Z90710 Acquired absence of both cervix and uterus: Secondary | ICD-10-CM | POA: Diagnosis not present

## 2014-04-02 DIAGNOSIS — Z9481 Bone marrow transplant status: Secondary | ICD-10-CM | POA: Diagnosis not present

## 2014-04-02 DIAGNOSIS — Z5111 Encounter for antineoplastic chemotherapy: Secondary | ICD-10-CM | POA: Diagnosis not present

## 2014-04-02 DIAGNOSIS — R531 Weakness: Secondary | ICD-10-CM | POA: Diagnosis not present

## 2014-04-02 DIAGNOSIS — K219 Gastro-esophageal reflux disease without esophagitis: Secondary | ICD-10-CM | POA: Diagnosis not present

## 2014-04-08 DIAGNOSIS — R5383 Other fatigue: Secondary | ICD-10-CM | POA: Diagnosis not present

## 2014-04-08 DIAGNOSIS — M5030 Other cervical disc degeneration, unspecified cervical region: Secondary | ICD-10-CM | POA: Diagnosis not present

## 2014-04-08 DIAGNOSIS — Z5111 Encounter for antineoplastic chemotherapy: Secondary | ICD-10-CM | POA: Diagnosis not present

## 2014-04-08 DIAGNOSIS — E876 Hypokalemia: Secondary | ICD-10-CM | POA: Diagnosis not present

## 2014-04-08 DIAGNOSIS — Z90710 Acquired absence of both cervix and uterus: Secondary | ICD-10-CM | POA: Diagnosis not present

## 2014-04-08 DIAGNOSIS — K219 Gastro-esophageal reflux disease without esophagitis: Secondary | ICD-10-CM | POA: Diagnosis not present

## 2014-04-08 DIAGNOSIS — K529 Noninfective gastroenteritis and colitis, unspecified: Secondary | ICD-10-CM | POA: Diagnosis not present

## 2014-04-08 DIAGNOSIS — Z9481 Bone marrow transplant status: Secondary | ICD-10-CM | POA: Diagnosis not present

## 2014-04-08 DIAGNOSIS — Z510 Encounter for antineoplastic radiation therapy: Secondary | ICD-10-CM | POA: Diagnosis not present

## 2014-04-08 DIAGNOSIS — R531 Weakness: Secondary | ICD-10-CM | POA: Diagnosis not present

## 2014-04-08 DIAGNOSIS — E119 Type 2 diabetes mellitus without complications: Secondary | ICD-10-CM | POA: Diagnosis not present

## 2014-04-08 DIAGNOSIS — Z79899 Other long term (current) drug therapy: Secondary | ICD-10-CM | POA: Diagnosis not present

## 2014-04-08 DIAGNOSIS — I10 Essential (primary) hypertension: Secondary | ICD-10-CM | POA: Diagnosis not present

## 2014-04-08 DIAGNOSIS — R42 Dizziness and giddiness: Secondary | ICD-10-CM | POA: Diagnosis not present

## 2014-04-08 DIAGNOSIS — C9002 Multiple myeloma in relapse: Secondary | ICD-10-CM | POA: Diagnosis not present

## 2014-04-08 DIAGNOSIS — G8929 Other chronic pain: Secondary | ICD-10-CM | POA: Diagnosis not present

## 2014-04-09 DIAGNOSIS — K529 Noninfective gastroenteritis and colitis, unspecified: Secondary | ICD-10-CM | POA: Diagnosis not present

## 2014-04-09 DIAGNOSIS — C9002 Multiple myeloma in relapse: Secondary | ICD-10-CM | POA: Diagnosis not present

## 2014-04-09 DIAGNOSIS — Z90710 Acquired absence of both cervix and uterus: Secondary | ICD-10-CM | POA: Diagnosis not present

## 2014-04-09 DIAGNOSIS — I10 Essential (primary) hypertension: Secondary | ICD-10-CM | POA: Diagnosis not present

## 2014-04-09 DIAGNOSIS — E876 Hypokalemia: Secondary | ICD-10-CM | POA: Diagnosis not present

## 2014-04-09 DIAGNOSIS — R531 Weakness: Secondary | ICD-10-CM | POA: Diagnosis not present

## 2014-04-09 DIAGNOSIS — R42 Dizziness and giddiness: Secondary | ICD-10-CM | POA: Diagnosis not present

## 2014-04-09 DIAGNOSIS — Z9481 Bone marrow transplant status: Secondary | ICD-10-CM | POA: Diagnosis not present

## 2014-04-09 DIAGNOSIS — R5383 Other fatigue: Secondary | ICD-10-CM | POA: Diagnosis not present

## 2014-04-09 DIAGNOSIS — K219 Gastro-esophageal reflux disease without esophagitis: Secondary | ICD-10-CM | POA: Diagnosis not present

## 2014-04-09 DIAGNOSIS — Z5111 Encounter for antineoplastic chemotherapy: Secondary | ICD-10-CM | POA: Diagnosis not present

## 2014-04-09 DIAGNOSIS — G8929 Other chronic pain: Secondary | ICD-10-CM | POA: Diagnosis not present

## 2014-04-09 DIAGNOSIS — E119 Type 2 diabetes mellitus without complications: Secondary | ICD-10-CM | POA: Diagnosis not present

## 2014-04-09 DIAGNOSIS — Z510 Encounter for antineoplastic radiation therapy: Secondary | ICD-10-CM | POA: Diagnosis not present

## 2014-04-09 DIAGNOSIS — Z79899 Other long term (current) drug therapy: Secondary | ICD-10-CM | POA: Diagnosis not present

## 2014-04-09 DIAGNOSIS — M5030 Other cervical disc degeneration, unspecified cervical region: Secondary | ICD-10-CM | POA: Diagnosis not present

## 2014-04-15 ENCOUNTER — Ambulatory Visit
Admit: 2014-04-15 | Disposition: A | Discharge status: Home / Self Care | Payer: Self-pay | Attending: Internal Medicine | Admitting: Internal Medicine

## 2014-04-15 DIAGNOSIS — E876 Hypokalemia: Secondary | ICD-10-CM | POA: Diagnosis not present

## 2014-04-15 DIAGNOSIS — Z9481 Bone marrow transplant status: Secondary | ICD-10-CM | POA: Diagnosis not present

## 2014-04-15 DIAGNOSIS — G893 Neoplasm related pain (acute) (chronic): Secondary | ICD-10-CM | POA: Diagnosis not present

## 2014-04-15 DIAGNOSIS — C9002 Multiple myeloma in relapse: Secondary | ICD-10-CM | POA: Diagnosis not present

## 2014-04-16 DIAGNOSIS — R252 Cramp and spasm: Secondary | ICD-10-CM | POA: Diagnosis not present

## 2014-04-16 DIAGNOSIS — I10 Essential (primary) hypertension: Secondary | ICD-10-CM | POA: Diagnosis not present

## 2014-04-16 DIAGNOSIS — M84421D Pathological fracture, right humerus, subsequent encounter for fracture with routine healing: Secondary | ICD-10-CM | POA: Diagnosis not present

## 2014-04-16 DIAGNOSIS — M25551 Pain in right hip: Secondary | ICD-10-CM | POA: Diagnosis not present

## 2014-04-16 DIAGNOSIS — E119 Type 2 diabetes mellitus without complications: Secondary | ICD-10-CM | POA: Diagnosis not present

## 2014-04-16 DIAGNOSIS — Z9481 Bone marrow transplant status: Secondary | ICD-10-CM | POA: Diagnosis not present

## 2014-04-16 DIAGNOSIS — C9000 Multiple myeloma not having achieved remission: Secondary | ICD-10-CM | POA: Diagnosis not present

## 2014-04-16 DIAGNOSIS — E876 Hypokalemia: Secondary | ICD-10-CM | POA: Diagnosis not present

## 2014-04-16 DIAGNOSIS — M25562 Pain in left knee: Secondary | ICD-10-CM | POA: Diagnosis not present

## 2014-04-16 DIAGNOSIS — R102 Pelvic and perineal pain: Secondary | ICD-10-CM | POA: Diagnosis not present

## 2014-04-16 DIAGNOSIS — Z510 Encounter for antineoplastic radiation therapy: Secondary | ICD-10-CM | POA: Diagnosis not present

## 2014-04-16 DIAGNOSIS — C9002 Multiple myeloma in relapse: Secondary | ICD-10-CM | POA: Diagnosis not present

## 2014-04-16 DIAGNOSIS — G893 Neoplasm related pain (acute) (chronic): Secondary | ICD-10-CM | POA: Diagnosis not present

## 2014-04-16 DIAGNOSIS — R531 Weakness: Secondary | ICD-10-CM | POA: Diagnosis not present

## 2014-04-16 DIAGNOSIS — M25561 Pain in right knee: Secondary | ICD-10-CM | POA: Diagnosis not present

## 2014-04-16 DIAGNOSIS — K529 Noninfective gastroenteritis and colitis, unspecified: Secondary | ICD-10-CM | POA: Diagnosis not present

## 2014-04-16 DIAGNOSIS — M25552 Pain in left hip: Secondary | ICD-10-CM | POA: Diagnosis not present

## 2014-04-16 DIAGNOSIS — Z79899 Other long term (current) drug therapy: Secondary | ICD-10-CM | POA: Diagnosis not present

## 2014-04-19 DIAGNOSIS — E119 Type 2 diabetes mellitus without complications: Secondary | ICD-10-CM | POA: Diagnosis not present

## 2014-04-21 DIAGNOSIS — C9002 Multiple myeloma in relapse: Secondary | ICD-10-CM | POA: Diagnosis not present

## 2014-04-24 DIAGNOSIS — Z510 Encounter for antineoplastic radiation therapy: Secondary | ICD-10-CM | POA: Diagnosis not present

## 2014-04-24 DIAGNOSIS — R252 Cramp and spasm: Secondary | ICD-10-CM | POA: Diagnosis not present

## 2014-04-24 DIAGNOSIS — I10 Essential (primary) hypertension: Secondary | ICD-10-CM | POA: Diagnosis not present

## 2014-04-24 DIAGNOSIS — K529 Noninfective gastroenteritis and colitis, unspecified: Secondary | ICD-10-CM | POA: Diagnosis not present

## 2014-04-24 DIAGNOSIS — Z79899 Other long term (current) drug therapy: Secondary | ICD-10-CM | POA: Diagnosis not present

## 2014-04-24 DIAGNOSIS — M25551 Pain in right hip: Secondary | ICD-10-CM | POA: Diagnosis not present

## 2014-04-24 DIAGNOSIS — G893 Neoplasm related pain (acute) (chronic): Secondary | ICD-10-CM | POA: Diagnosis not present

## 2014-04-24 DIAGNOSIS — R531 Weakness: Secondary | ICD-10-CM | POA: Diagnosis not present

## 2014-04-24 DIAGNOSIS — E119 Type 2 diabetes mellitus without complications: Secondary | ICD-10-CM | POA: Diagnosis not present

## 2014-04-24 DIAGNOSIS — M25562 Pain in left knee: Secondary | ICD-10-CM | POA: Diagnosis not present

## 2014-04-24 DIAGNOSIS — C9002 Multiple myeloma in relapse: Secondary | ICD-10-CM | POA: Diagnosis not present

## 2014-04-24 DIAGNOSIS — M25552 Pain in left hip: Secondary | ICD-10-CM | POA: Diagnosis not present

## 2014-04-24 DIAGNOSIS — E876 Hypokalemia: Secondary | ICD-10-CM | POA: Diagnosis not present

## 2014-04-24 DIAGNOSIS — R102 Pelvic and perineal pain: Secondary | ICD-10-CM | POA: Diagnosis not present

## 2014-04-24 DIAGNOSIS — M84421D Pathological fracture, right humerus, subsequent encounter for fracture with routine healing: Secondary | ICD-10-CM | POA: Diagnosis not present

## 2014-04-24 DIAGNOSIS — M25561 Pain in right knee: Secondary | ICD-10-CM | POA: Diagnosis not present

## 2014-04-24 DIAGNOSIS — Z9481 Bone marrow transplant status: Secondary | ICD-10-CM | POA: Diagnosis not present

## 2014-04-28 DIAGNOSIS — R252 Cramp and spasm: Secondary | ICD-10-CM | POA: Diagnosis not present

## 2014-04-28 DIAGNOSIS — M25562 Pain in left knee: Secondary | ICD-10-CM | POA: Diagnosis not present

## 2014-04-28 DIAGNOSIS — Z79899 Other long term (current) drug therapy: Secondary | ICD-10-CM | POA: Diagnosis not present

## 2014-04-28 DIAGNOSIS — C9002 Multiple myeloma in relapse: Secondary | ICD-10-CM | POA: Diagnosis not present

## 2014-04-28 DIAGNOSIS — M84421D Pathological fracture, right humerus, subsequent encounter for fracture with routine healing: Secondary | ICD-10-CM | POA: Diagnosis not present

## 2014-04-28 DIAGNOSIS — M25561 Pain in right knee: Secondary | ICD-10-CM | POA: Diagnosis not present

## 2014-04-28 DIAGNOSIS — R102 Pelvic and perineal pain: Secondary | ICD-10-CM | POA: Diagnosis not present

## 2014-04-28 DIAGNOSIS — K529 Noninfective gastroenteritis and colitis, unspecified: Secondary | ICD-10-CM | POA: Diagnosis not present

## 2014-04-28 DIAGNOSIS — I10 Essential (primary) hypertension: Secondary | ICD-10-CM | POA: Diagnosis not present

## 2014-04-28 DIAGNOSIS — M25551 Pain in right hip: Secondary | ICD-10-CM | POA: Diagnosis not present

## 2014-04-28 DIAGNOSIS — Z9481 Bone marrow transplant status: Secondary | ICD-10-CM | POA: Diagnosis not present

## 2014-04-28 DIAGNOSIS — E119 Type 2 diabetes mellitus without complications: Secondary | ICD-10-CM | POA: Diagnosis not present

## 2014-04-28 DIAGNOSIS — E876 Hypokalemia: Secondary | ICD-10-CM | POA: Diagnosis not present

## 2014-04-28 DIAGNOSIS — G893 Neoplasm related pain (acute) (chronic): Secondary | ICD-10-CM | POA: Diagnosis not present

## 2014-04-28 DIAGNOSIS — M25552 Pain in left hip: Secondary | ICD-10-CM | POA: Diagnosis not present

## 2014-04-28 DIAGNOSIS — R531 Weakness: Secondary | ICD-10-CM | POA: Diagnosis not present

## 2014-04-28 DIAGNOSIS — Z510 Encounter for antineoplastic radiation therapy: Secondary | ICD-10-CM | POA: Diagnosis not present

## 2014-05-01 DIAGNOSIS — Z9481 Bone marrow transplant status: Secondary | ICD-10-CM | POA: Diagnosis not present

## 2014-05-01 DIAGNOSIS — R531 Weakness: Secondary | ICD-10-CM | POA: Diagnosis not present

## 2014-05-01 DIAGNOSIS — M25551 Pain in right hip: Secondary | ICD-10-CM | POA: Diagnosis not present

## 2014-05-01 DIAGNOSIS — R102 Pelvic and perineal pain: Secondary | ICD-10-CM | POA: Diagnosis not present

## 2014-05-01 DIAGNOSIS — M25562 Pain in left knee: Secondary | ICD-10-CM | POA: Diagnosis not present

## 2014-05-01 DIAGNOSIS — M25552 Pain in left hip: Secondary | ICD-10-CM | POA: Diagnosis not present

## 2014-05-01 DIAGNOSIS — E119 Type 2 diabetes mellitus without complications: Secondary | ICD-10-CM | POA: Diagnosis not present

## 2014-05-01 DIAGNOSIS — G893 Neoplasm related pain (acute) (chronic): Secondary | ICD-10-CM | POA: Diagnosis not present

## 2014-05-01 DIAGNOSIS — M84421D Pathological fracture, right humerus, subsequent encounter for fracture with routine healing: Secondary | ICD-10-CM | POA: Diagnosis not present

## 2014-05-01 DIAGNOSIS — R252 Cramp and spasm: Secondary | ICD-10-CM | POA: Diagnosis not present

## 2014-05-01 DIAGNOSIS — Z510 Encounter for antineoplastic radiation therapy: Secondary | ICD-10-CM | POA: Diagnosis not present

## 2014-05-01 DIAGNOSIS — E876 Hypokalemia: Secondary | ICD-10-CM | POA: Diagnosis not present

## 2014-05-01 DIAGNOSIS — K529 Noninfective gastroenteritis and colitis, unspecified: Secondary | ICD-10-CM | POA: Diagnosis not present

## 2014-05-01 DIAGNOSIS — C9002 Multiple myeloma in relapse: Secondary | ICD-10-CM | POA: Diagnosis not present

## 2014-05-01 DIAGNOSIS — M25561 Pain in right knee: Secondary | ICD-10-CM | POA: Diagnosis not present

## 2014-05-01 DIAGNOSIS — I10 Essential (primary) hypertension: Secondary | ICD-10-CM | POA: Diagnosis not present

## 2014-05-01 DIAGNOSIS — Z79899 Other long term (current) drug therapy: Secondary | ICD-10-CM | POA: Diagnosis not present

## 2014-05-16 ENCOUNTER — Ambulatory Visit
Admit: 2014-05-16 | Disposition: A | Discharge status: Home / Self Care | Payer: Self-pay | Attending: Internal Medicine | Admitting: Internal Medicine

## 2014-05-22 DIAGNOSIS — Z9481 Bone marrow transplant status: Secondary | ICD-10-CM | POA: Diagnosis not present

## 2014-05-22 DIAGNOSIS — K529 Noninfective gastroenteritis and colitis, unspecified: Secondary | ICD-10-CM | POA: Diagnosis not present

## 2014-05-22 DIAGNOSIS — D649 Anemia, unspecified: Secondary | ICD-10-CM | POA: Diagnosis not present

## 2014-05-22 DIAGNOSIS — R630 Anorexia: Secondary | ICD-10-CM | POA: Diagnosis not present

## 2014-05-22 DIAGNOSIS — M5030 Other cervical disc degeneration, unspecified cervical region: Secondary | ICD-10-CM | POA: Diagnosis not present

## 2014-05-22 DIAGNOSIS — C9002 Multiple myeloma in relapse: Secondary | ICD-10-CM | POA: Diagnosis not present

## 2014-05-22 DIAGNOSIS — E876 Hypokalemia: Secondary | ICD-10-CM | POA: Diagnosis not present

## 2014-05-22 DIAGNOSIS — Z79899 Other long term (current) drug therapy: Secondary | ICD-10-CM | POA: Diagnosis not present

## 2014-05-22 DIAGNOSIS — E119 Type 2 diabetes mellitus without complications: Secondary | ICD-10-CM | POA: Diagnosis not present

## 2014-05-22 DIAGNOSIS — R42 Dizziness and giddiness: Secondary | ICD-10-CM | POA: Diagnosis not present

## 2014-05-22 DIAGNOSIS — I10 Essential (primary) hypertension: Secondary | ICD-10-CM | POA: Diagnosis not present

## 2014-05-22 LAB — BASIC METABOLIC PANEL
Anion Gap: 8 (ref 7–16)
BUN: 24 mg/dL — AB
Calcium, Total: 8.4 mg/dL — ABNORMAL LOW
Chloride: 94 mmol/L — ABNORMAL LOW
Co2: 27 mmol/L
Creatinine: 0.88 mg/dL
EGFR (African American): >60
EGFR (Non-African Amer.): >60
GLUCOSE: 141 mg/dL — AB
Potassium: 3.9 mmol/L
SODIUM: 129 mmol/L — AB

## 2014-05-22 LAB — CBC CANCER CENTER
Basophil #: 0 x10 3/mm (ref 0.0–0.1)
Basophil %: 0.1 %
EOS ABS: 0 x10 3/mm (ref 0.0–0.7)
Eosinophil %: 0 %
HCT: 26.9 % — AB (ref 35.0–47.0)
HGB: 9.1 g/dL — AB (ref 12.0–16.0)
Lymphocyte #: 1.2 x10 3/mm (ref 1.0–3.6)
Lymphocyte %: 35.3 %
MCH: 33.3 pg (ref 26.0–34.0)
MCHC: 33.7 g/dL (ref 32.0–36.0)
MCV: 99 fL (ref 80–100)
MONO ABS: 0.2 x10 3/mm (ref 0.2–0.9)
Monocyte %: 7.1 %
NEUTROS ABS: 1.9 x10 3/mm (ref 1.4–6.5)
Neutrophil %: 57.5 %
Platelet: 84 x10 3/mm — ABNORMAL LOW (ref 150–440)
RBC: 2.72 10*6/uL — AB (ref 3.80–5.20)
RDW: 17.5 % — ABNORMAL HIGH (ref 11.5–14.5)
WBC: 3.3 x10 3/mm — ABNORMAL LOW (ref 3.6–11.0)

## 2014-05-22 LAB — PHOSPHORUS: PHOSPHORUS: 4.3 mg/dL

## 2014-05-22 LAB — HEPATIC FUNCTION PANEL A (ARMC)
AST: 88 U/L — AB
Albumin: 2.7 g/dL — ABNORMAL LOW
Alkaline Phosphatase: 178 U/L — ABNORMAL HIGH
Bilirubin, Direct: 0.2 mg/dL
Bilirubin,Total: 0.7 mg/dL
Indirect Bilirubin: 0.5
SGPT (ALT): 58 U/L — ABNORMAL HIGH
Total Protein: 10.7 g/dL — ABNORMAL HIGH

## 2014-05-22 LAB — MAGNESIUM: MAGNESIUM: 2.1 mg/dL

## 2014-06-07 NOTE — Consult Note (Signed)
Brief Consult Note: Diagnosis: Compression fracture thoracic spine.   Patient was seen by consultant.   Comments: Treat patient's pain symptomatically.  No neurologic deficits.  If pain worsens patient may need an MRI to determine age of compression fracture to determine whether she would be a candidate for kyphoplasty by Dr. Rudene Christians or Dr. Consuela Mimes.  Electronic Signatures: Thornton Park (MD)  (Signed 23-Feb-15 17:41)  Authored: Brief Consult Note   Last Updated: 23-Feb-15 17:41 by Thornton Park (MD)

## 2014-06-07 NOTE — Consult Note (Signed)
PATIENT NAME:  Joyce Schultz, Joyce Schultz MR#:  [id number] DATE OF BIRTH:  Mar 25, 1940  DATE OF CONSULTATION:  04/08/2013  CONSULTING PHYSICIAN:  Timoteo Gaul, MD  REASON FOR CONSULTATION: Thoracic compression fracture.   HISTORY OF PRESENT ILLNESS: Joyce Schultz is a 74 year old female with a history of multiple myeloma which according to the admission H and P has relapsed. She has been undergoing chemotherapy here at our cancer center.  The patient, on recent CAT scan, was found to have multiple liver lesions and lesions in the ribs. Recent chest films were performed, which showed new compression fracture compared to her prior films in July of last year.   I have reviewed the patient's past medical history from her H and P in the electronic medical record. I reviewed the patient's past medical history, past surgical history, family and social history, as well as medication and allergies.   PHYSICAL EXAMINATION: I saw the patient at the bedside. She was with 2 family members. The patient was resting comfortably, semiupright. She stated that she does have mild pain in the thoracic portion of her back, which is mainly right-sided. She denies any numbness or tingling in the upper or lower extremities. She denied any weakness or loss of bowel or bladder function.   The patient has no obvious deformity to the spine. There is no area of erythema or ecchymosis or swelling. She had tenderness in the area of the thoracic spine which extended more to the right paraspinal area. She was able to take a deep breath without discomfort. She was having no trouble with shortness of breath. The patient did not have significant tenderness over the posterior spinous processes. The patient had full motor and sensory function in the bilateral lower extremities with palpable pedal pulses.   RADIOLOGY:  AP and lateral chest films were reviewed today, taken on 04/08/2013. These films demonstrate what appears to be a compression fracture  at approximately T7. The patient has had prior kyphoplasties performed by Dr. Mauri Pole, which are evidenced with what appear to be T12 and L3. There is no abnormal curvature to the spine on the AP films. The patient has slight increased kyphosis on the lateral view.   ASSESSMENT: Thoracic compression fracture at either T7 or T8.   PLAN:  Joyce Schultz is having some tenderness, which is more right-sided than directly in the midline. There may be a related compression fracture. She is not having any neurologic deficits. At this point, I would not recommend any acute intervention. If her pain worsens, then I would order an MRI to determine the age of this compression fracture. This would be done if the patient was having tremendous pain and was considering kyphoplasty procedure, which could be done here at [facility] by either Dr. Rudene Christians or Dr. Dossie Arbour in pain management. I have nothing further to offer this patient. We will sign off for now.  Her pain for now should be treated symptomatically.    ____________________________ Timoteo Gaul, MD klk:dmm D: 04/08/2013 17:48:37 ET T: 04/08/2013 19:49:36 ET JOB#: [id number]  cc: Timoteo Gaul, MD, <Dictator> Timoteo Gaul MD ELECTRONICALLY SIGNED 04/09/2013 15:58

## 2014-06-07 NOTE — Consult Note (Signed)
ONCOLOGY followup - overall feels slightly stronger, on 2L [place] O2. still very weak. Oral intake improving.no fevers. No bleeding issues.weak, otherwise alert and oriented and converses appropriately.            vitals - afebrile, stable, on [place] O2            lungs - b/l good air entry, no creps            Abd - soft, nontender. BS present. Cr 1.7, Ca 9.1.   1. Recurrent multiple myeloma - Pomalyst was just started but currently placed on hold given ongoing acute issues.Newly diagnosed multiple liver lesions and expansile right rib mass - serum CA 27.29 is abnormal. Possibly has a second malignancy which has metastasized to liver. Given that Cr has  reached maximum improvement and she is stable, will request Ultrasound-guided biopsy of both right rib mass and a liver lesion tomorrow if possible. Check platelet count and PT/INR.Acute renal failure - better, continue current supportive treatment. Nephrology following.continue to follow.  Electronic Signatures: Jonn Shingles (MD)  (Signed on 23-Feb-15 22:58)  Authored  Last Updated: 23-Feb-15 22:58 by Jonn Shingles (MD)

## 2014-06-07 NOTE — Discharge Summary (Signed)
PATIENT NAME:  Joyce Schultz, TRAMONTANA MR#:  [id number] DATE OF BIRTH:  [birth year]  DATE OF ADMISSION:  05/30/2013 DATE OF DISCHARGE:  06/01/2013  DISCHARGE DIAGNOSES:  1. Acute on chronic back pain secondary to multiple myeloma.  2. Pancytopenia.   CONSULTANTS: Dr. Ma Hillock with oncology.   IMAGING STUDIES: MRI of the lumbar spine without contrast shows metastatic disease involving entire visualized portion of the spine with tumor extending into spinal canal, compressing the thecal sac at L3. No acute pathological fractures.   ADMITTING HISTORY AND PHYSICAL: Please see detailed H and P dictated by Dr. Posey Pronto. In brief, a 74 year old African American female patient with history of multiple myeloma, chronic back with acutely worsening back pain, needing IV pain medications and admitted to hospitalist service.   HOSPITAL COURSE:  Acute on chronic back pain secondary to multiple myeloma. The patient was started on IV steroids, IV medications, Fentanyl dose increased, added p.o. medications in which the pain was much better controlled. The patient has ambulated in the hallway. The patient is thought to need radiation oncology, but does not have to stay in the hospital for this. The patient feels that she is comfortable going home with help from her sister and husband who stay with her and is being discharged back home.  Prior to discharge, the patient's motor strength is 5/5 in upper extremities. Lungs sound clear. Heart shows S1, S2.   The patient was seen by Dr. Ma Hillock of oncology who she will follow up with.   The patient did receive a dose of Neulasta up for her low white count from her recent chemotherapy. She has pancytopenia from multiple myeloma and chemotherapy.   DISCHARGE MEDICATIONS:  1. Fentanyl 125 mcg patch every 3 days.  2. Prochlorperazine 10 mg oral 3 times a day as needed for nausea, vomiting. 3. Gabapentin 600 mg oral 3 times a day.  4. Acyclovir 400 mg oral 2 times a day.  5. Anusol  rectal cream daily as needed.  6. MiraLax 17 grams oral 2 times a day as needed for constipation.  7. Oxycodone 10 mg oral every 6 hours as needed for pain.   DISCHARGE INSTRUCTIONS: Regular diet, regular consistency.  ACTIVITY: As tolerated.   FOLLOWUP: With radiation oncology and Dr. Ma Hillock in 1 to 2 days.   TIME SPENT ON DAY OF DISCHARGE: Discharge activity was 45 minutes    ____________________________ Leia Alf. Eual Lindstrom, MD srs:lt D: 06/02/2013 14:12:25 ET T: 06/02/2013 21:54:02 ET JOB#: [id number]  cc: Alveta Heimlich R. Jabron Weese, MD, <Dictator> Neita Carp MD ELECTRONICALLY SIGNED 06/08/2013 11:38

## 2014-06-07 NOTE — H&P (Signed)
PATIENT NAME:  Joyce Schultz, Joyce Schultz MR#:  [id number] DATE OF BIRTH:  Dec 17, 1940  DATE OF ADMISSION:  03/29/2013  PRIMARY CARE PHYSICIAN:  Dr. Otilio Miu  ONCOLOGIST:  Dr. Ma Hillock  CHIEF COMPLAINT: Abnormal labs.   HISTORY OF PRESENT ILLNESS:  [name] is a pleasant, 74 year old Caucasian female, who has history of multiple myeloma, which is relapsed on bone marrow. Has been undergoing chemotherapy at the [facility]. Was recently found to have abdominal pain. Underwent CAT scan of the abdomen with IV contrast on February 12. Was found to have multiple liver lesions and lesions in the ribs, and was asked to come by Dr. Ma Hillock to discuss the results of the CAT scan. More labs were drawn this afternoon by Dr. Ma Hillock in attempt to do work up on the liver lesions, and it was found that patient's creatinine had gone up to 8.8. Her baseline creatinine is 1.12 in January 2015. She was asked to come back to the Emergency Room and be admitted for IV fluids. The patient denies any symptoms at this time. However, does complain of some cramping in her bilateral knees, and poor appetite. She denies any significant weight loss.   Last week, patient was seen by Dr. Otilio Miu. She just completed a course of Bactrim for one whole week for her UTI, and was also given indomethacin for presumed gout of her ankles. The patient is currently hemodynamically stable; however, appears clinically very dehydrated, and is being admitted with acute renal failure.   PAST MEDICAL HISTORY:  1. Multiple myeloma, with relapse. 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 Her skeletal survey in January 2014 showed lesions in calvarium, upper ribs, humeral shaft, radius and ulna. She is also getting chemotherapy along with Zometa at the [facility]. She has also been on a new chemo medication called Pomalyst.   2. Abnormal CT of the abdomen and pelvis, with new liver lesions that were noted on  03/28/2013.  Work up by Dr. Ma Hillock.    3.  Hypertension.   4. Diabetes.   5.  Gastroesophageal reflux disease.   6.  History of colitis in ascending colon by colonoscopy in June 2014. The patient is on mesalamine.  7.  History of persistent right flank pain. MRI of the spine showed multilevel degenerative disk and cervical stenosis, severe at C4-5 C5-6 levels.   PAST SURGICAL HISTORY: Hysterectomy and appendectomy.   FAMILY HISTORY:  Breast cancer and diabetes.   SOCIAL HISTORY: Does not smoke. Does not drink. Does not use any recreational drugs.  HOME MEDICATIONS: 1.  Fentanyl patch 100 mcg extended-release every 72 hours.  2.  K-Dur 20 mEq p.o. b.i.d. for 5 days, and then continue p.o. daily, so potassium chloride 20 mEq p.o. daily.  3.  Neurontin 600 mg 2 tablets 3 times a day.  4.  Hydrochlorothiazide/lisinopril 12.5/10, 1 p.o. daily.  5.  Prochlorperazine 10 mg 1 tablet 3 times a day.  6.  Aspirin 81 mg daily.  7.  Senokot 8.6, 2 tablets daily.  8.  Milk of mag 15 mL as needed.  9.  Cyclobenzaprine 10 mg 3 times a day as needed.  10.  Apriso, which is mesalamine, 0.375 grams 4 capsules once a day.  11. Bactrim DS 1 tablet b.i.d. for 10 days. The patient just finished a course today, which is February 13.   12.  Indomethacin 50 mg 1 capsule 3 times a day. This was also started  about a week ago.  13.  Pomalyst 4 mg 1 p.o. daily. This is her chemotherapy drug.  REVIEW OF SYSTEMS:   CONSTITUTIONAL: Positive for fatigue and weakness, with mild low-grade fever  today.  EYES: No blurred or double vision.  ENT: No tinnitus, ear pain, hearing loss, epistaxis or discharge.  RESPIRATORY: No cough, wheeze, hemoptysis, COPD.   CARDIOVASCULAR: No chest pain, orthopnea, edema, or dyspnea on exertion.  GASTROINTESTINAL: No nausea, vomiting, diarrhea, abdominal pain, or GERD.  GENITOURINARY: No dysuria, hematuria, frequency.  ENDOCRINE: No polyuria, nocturia, or thyroid problems.   HEMATOLOGY: No anemia or easy bruising.  SKIN: No acne or rash.  MUSCULOSKELETAL: Positive for muscle cramps. Positive for neck pain and arthritis.  NEUROLOGIC: No CVA, TIA, vertigo, or ataxia.  PSYCHIATRIC: No anxiety or bipolar disorder.  All other systems reviewed and negative.   PHYSICAL EXAMINATION: GENERAL: The patient is awake, alert, oriented x 3, appears dehydrated.  VITAL SIGNS:  Temperature 100.5, pulse is 95, respirations 18, blood pressure is 116/60, sats are 94% on room air.  HEENT: Atraumatic, normocephalic. PERLA. EOMs intact. Oral mucosa is dry.  NECK: Supple. No JVD. No carotid bruit.  RESPIRATORY: Clear to auscultation bilaterally. No rales, rhonchi, respiratory distress or labored breathing.  CARDIOVASCULAR: Both the heart sounds are normal. Rate, rhythm regular. PMI not lateralized. Chest nontender. Good pedal pulses, good femoral pulses. No lower extremity edema.  ABDOMEN: Soft, benign. There is some diffuse tenderness in the left lower quadrant. No guarding, rigidity, or any mass felt. Good bowel sounds.  NEUROLOGIC:  Grossly intact cranial nerves II through XII. No motor or sensory deficit.  PSYCHIATRIC: The patient is awake, alert, oriented x 3.  SKIN: Warm and dry.   LABS:  UA negative for UTI. White count is 5.3, H and H is 8.4 and 25.8, platelet count is 184. LFTs are within normal limits, except SGOT of 59, total protein of 12.8, and albumin of 2.8. PT/INR is 14.8 and 1.2. CT of the abdomen and pelvis with contrast done on February 12 shows new right anterior medial rib metastasis with exophytic soft tissue mass along the right chest wall. Mass is likely palpable. New widespread hepatic metastasis. Stable lucent skeletal metastasis within the pelvis and spine. Digital diagnostic mammogram showed a solid mass at the right breast inframammary fold region, 8 o'clock position, with rib involvement. 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 Small right axillary lymph node is noted and can be biopsied with ultrasound guidance if needed. Repeat sodium is 128, potassium is 5.3, chloride is 97.   ASSESSMENT: A 74 year old Janah Mcculloh with history of multiple myeloma with relapse, history of hypertension, diabetes, and ongoing treatment for her relapsed multiple myeloma, comes in to the Emergency Room with abnormally elevated creatinine. Her creatinine today is 8.8. She is going to be admitted with:   1.  Acute renal failure with what appears to be ATN versus prerenal azotemia in the setting of recent intake of Bactrim DS for one week, along with indomethacin, poor p.o. intake, and contrast-induced nephropathy as well. The patient will be admitted on the medical floor. Will give IV aggressive fluids. She received a liter of bolus fluid. Will continue IV fluids at 200 mL an hour. Continue monitoring Is and Os. Ultrasound of the kidneys will be ordered. Case was discussed with Dr. Candiss Norse, who agrees with the plan. Will monitor creatinine very closely. The patient appears to be clinically dehydrated.   2.  Abnormal CT of the abdomen and pelvis, with new solid mass, which is present on the right breast, with possible metastatic lesions to the liver. Work up per Dr. Ma Hillock. Plan is to get biopsy of either liver lesion or the breast lesion.   3.  Hyponatremia and hyperkalemia secondary to acute renal failure and dehydration. Continue aggressive IV fluid and monitor labs closely.   4.  Relative hypotension with dehydration. Will hold off on blood pressure meds at this time.   5.  History of colitis in ascending colon by colonoscopy June 2014. The patient was on mesalamine. The patient reports she is not taking it anymore. She does not have any active symptoms at this time.   6.  Chronic pain secondary to skeletal lesions from multiple myeloma. The patient is on fentanyl patch, which we will continue.   7.  Deep vein thrombosis prophylaxis  with subcu heparin.   Further work up according to patient's clinical course. Hospital admission plan was discussed with patient. Case was also discussed with Dr. Candiss Norse from Nephrology.    TIME SPENT: 55 minutes.      ____________________________ Hart Rochester Posey Pronto, MD sap:mr D: 03/29/2013 21:20:31 ET T: 03/29/2013 21:51:02 ET JOB#: [id number]  cc: Brynne Doane A. Posey Pronto, MD, <Dictator> Juline Patch, MD Sandeep R. Ma Hillock, MD   Ilda Basset MD ELECTRONICALLY SIGNED 04/04/2013 17:24

## 2014-06-07 NOTE — H&P (Signed)
PATIENT NAME:  Joyce Schultz, Joyce Schultz MR#:  [id number] DATE OF BIRTH:  [birth year]  DATE OF ADMISSION:  05/30/2013  PRIMARY CARE PROVIDER: Dr. Otilio Miu.  EMERGENCY DEPARTMENT REFERRING PHYSICIAN: Dr. Benjaman Lobe.   CHIEF COMPLAINT: Lower back pain.   HISTORY OF PRESENT ILLNESS: The patient is a 74 year old African American female with history of relapsed multiple myeloma with metastatic disease. She also apparently has a breast mass and is followed by Dr. Ma Hillock, who presents with acute onset of lower back pain; that started all of a sudden. The patient came to the ED and had an x-ray, which showed a lumbar new fracture in the L4 region. Therefore, the ED physician spoke to Dr. Ma Hillock, who recommends the patient be admitted for pain control. The patient does report that the pain is sharp and every time she moves it hurts to even walk. She has not had any fevers, chills. She does have shortness of breath. Denies any nausea, vomiting or diarrhea.   PAST MEDICAL HISTORY:  1.  History of hypertension, diabetes, but she is not on any medications for these now.  2.  GERD.  3.  History of multiple myeloma with metastatic disease. Has received multiple treatments.  4.  History of right-sided abdominal pain.  5.  History of diastolic congestive heart failure in the past.  6.  Pancytopenia related to her multiple myeloma.   PAST SURGICAL HISTORY:  Status post hysterectomy, appendectomy.   FAMILY HISTORY: Breast cancer and diabetes.   SOCIAL HISTORY: Does not smoke. Does not drink. No drug use.   CURRENT MEDICATIONS: At outpatient, acyclovir 400 mg 1 tab p.o. b.i.d., Anusol 1 application rectally b.i.d., fentanyl 100 mcg patch q.72h., gabapentin 600 mg 2 tabs t.i.d., lactulose 30 mL t.i.d., magnesium hydroxide 8% solution 17 grams b.i.d., MiraLAX 17 grams b.i.d., oxycodone 5 mg 1 to 2 tabs q.3-4 hours as needed, Pomalyst 4 mg daily, prochlorperazine 10 mg 1 tab p.o. t.i.d., Senokot 2 tabs at bedtime.   SOCIAL  HISTORY: Does not smoke. Does not drink. Does not do any recreational drugs.   REVIEW OF SYSTEMS:  CONSTITUTIONAL: Denies any fevers. Complains of fatigue, weakness, pain.  EYES: No blurred or double vision. No redness. No inflammation. No glaucoma.  ENT: No tinnitus. No ear pain. No hearing loss. No seasonal or year-round allergies. No epistaxis. No difficulty swallowing.  RESPIRATORY: Denies any cough, wheezing, hemoptysis.  CARDIOVASCULAR: Denies any chest pain, orthopnea or edema.  GASTROINTESTINAL: No nausea, vomiting, diarrhea. No abdominal pain.    GENITOURINARY: Denies any dysuria, hematuria, renal calc or frequency.  ENDOCRINE: Denies any polyuria, nocturia or thyroid problems.  HEMATOLOGIC AND LYMPHATIC: Has anemia. Denies easy bruisability or bleeding.  SKIN: No acne or rash.  MUSCULOSKELETAL: Complains of pain in the lower back.  NEUROLOGIC:  No numbness, CVA, TIA.  PSYCHIATRIC: No anxiety, insomnia or ADD.   EVALUATIONS IN THE ED:  Lumbar spine x-ray shows new compression fracture of L4.  WBC 2.4, hemoglobin 8.1, platelet count 233. Glucose 102, BUN 14, creatinine 0.90, sodium 137, potassium 4.2, chloride 104. CO2 is 29. Urinalysis is negative. T-spine also shows progression and moderate compression deformity at T7 from previous.  ASSESSMENT AND PLAN: The patient is a 74 year old with metastatic multiple myeloma, who is still a FULL CODE, presents with acute onset of back pain, followed by Dr. Ma Hillock.    1. Acute L4 fracture, likely metastatic, also worsening thoracic fracture. The ED M.D. has spoken to Dr. Ma Hillock. He will see the patient tomorrow.  At this time, we will admit her for pain control. I will place her on some prednisone as well.  2.  Multiple myeloma with metastatic. Prognosis is very poor. Still FULL CODE.  3. History of diastolic congestive heart failure, currently compensated. No evidence of CHF.  4.  Pancytopenia due to malignancy. Need to follow her CBC.   CODE  STATUS: FULL, SEEN BY PALLIATIVE CARE IN THE PAST. THE PATIENT SHOULD BE DNR. DR. Ma Hillock NEEDS TO ADDRESS THIS WITH HER.     NOTE:  Time spent, 45 minutes.    ____________________________ Lafonda Mosses Posey Pronto, MD shp:dmm D: 05/30/2013 19:27:14 ET T: 05/30/2013 19:42:24 ET JOB#: [id number]  cc: Rajni Holsworth H. Posey Pronto, MD, <Dictator> Alric Seton MD ELECTRONICALLY SIGNED 05/31/2013 13:52

## 2014-06-07 NOTE — Consult Note (Signed)
ONCOLOGY followup note - still very weak. Had SOB last night and is on [place] O2, feels better. Oral intake slowly improving.no fevers. No bleeding issues.weak, otherwise awake and oriented and converses appropriately.            vitals - 98.1, 84, 18, 155/87, 97% on 3L O2            lungs - b/l good air entry, no creps            Abd - soft, nontender. BS present. BUN 36, Cr 3.97, Ca 6.8. Ultrasound kidneys does not report any obstructive etiology.  1. Recurrent multiple myeloma - Pomalyst was just started but currently placed on hold given ongoing acute issues.Newly diagnosed multiple liver lesions and expansile right rib mass - serum CA 27.29 is abnormal. Possible has a second malignancy which has metastasized to liver. Once Cr improves further and she is stable, wil need Ultrasound-guided biopsy of both right rib mass and a liver lesion.Acute renal failure - improving, continue current supportive treatment. Nephrology following.Code status discussed with patient, she wants to think about her decision.continue to follow.  Electronic Signatures: Jonn Shingles (MD)  (Signed on 16-Feb-15 15:41)  Authored  Last Updated: 16-Feb-15 15:41 by Jonn Shingles (MD)

## 2014-06-07 NOTE — Consult Note (Signed)
PATIENT NAME:  Joyce Schultz, Joyce Schultz MR#:  [id number] DATE OF BIRTH:  Oct 09, 1940  DATE OF CONSULTATION:  05/31/2013  REFERRING PHYSICIAN:  Dustin Flock, MD CONSULTING PHYSICIAN:  Iam Lipson R. Ma Hillock, MD  REASON FOR CONSULTATION: Multiple myeloma with severe low back pain.   HISTORY OF PRESENT ILLNESS: The patient is a 74 year old female with past medical history significant for hypertension, diabetes, GERD, esophageal motility disorder on EGD, history of colitis, history of chronic back pain with multilevel degenerative disk disease and thecal sac stenosis more severe at C4-C5 and C5-C6 levels, hysterectomy, appendectomy and long-standing history of multiple myeloma status post multiple lines of treatment since 2006 when it was initially diagnosed. 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 More recently myeloma showed recurrence and she was started on oral Pomalyst. The patient presented to the hospital with severe low back pain causing her to be unable to get up and ambulate, but otherwise no actual muscle weakness in lower extremity. She had x-ray done and MRI of the lumbar spine which shows metastatic disease involving  the entire visualized portion of the spine with tumor extending into the spinal cord spine, spinal canal compressing the thecal sac L3, no pathological fractures and liver metastasis. The patient states that pain is under better control as long as she does not move around. Denies any new onset muscle weakness, urinary or fecal incontinence or retention.   PAST MEDICAL HISTORY AND PAST SURGICAL HISTORY: As in HPI above.   FAMILY HISTORY: Remarkable for diabetes and breast cancer.   SOCIAL HISTORY: Denies smoking or alcohol usage. Ambulates slowly given age and medical problems.   REVIEW OF SYSTEMS: CONSTITUTIONAL: Generalized weakness and fatigue on exertion. No fevers or chills.  HEENT: No headaches, dizziness at rest. No epistaxis, ear  or jaw pain. No sinus symptoms.  CARDIAC: No angina, palpitation, orthopnea, or PND.  LUNGS: No new dyspnea, cough, sputum, or hemoptysis.  GASTROINTESTINAL: No nausea or vomiting. Has intermittent constipation. No diarrhea or blood in stools or melena.  GENITOURINARY: No dysuria or hematuria.  MUSCULOSKELETAL: As in HPI. NEUROLOGIC: No new focal weakness, seizures or loss of consciousness. No headaches.  EXTREMITIES: As in HPI. No new swelling.  ENDOCRINE: No polyuria or polydipsia. Appetite is fair.   PHYSICAL EXAMINATION: GENERAL: The patient is frail-looking, resting in bed, otherwise alert and oriented and in no acute distress. No icterus. Pallor present.  VITAL SIGNS: 98.1, 62, 20, 105/61, 96% on room air.  HEENT: Normocephalic, atraumatic. Extraocular movements intact. No oral thrush. Mouth is moist.  NECK: Negative for lymphadenopathy. HEART: S1 and S2, regular rate and rhythm.  LUNGS: Bilateral good air entry, decreased at bases.  ABDOMEN: Soft, nontender. No hepatosplenomegaly clinically.  EXTREMITIES: No major edema or cyanosis.  SKIN: No generalized rashes or major bruising.  NEUROLOGIC: Cranial nerves are intact, moves all extremities spontaneously.   LABORATORY DATA: WBC 1800, ANC 400, hemoglobin 7.2, platelets 220,000, MCV 103. Creatinine 0.78, calcium 8.2, potassium 3.9, glucose 108.   IMPRESSION AND RECOMMENDATIONS: The patient is a 74 year old with history of multiple medical problems as described above including long-standing history of multiple myeloma status post autologous bone marrow transplant and then relapsed and more recently has started on Pomalyst orally for salvage therapy. The patient now admitted with severe low back pain causing difficulty in ambulation. X-ray and MRI are suggestive of acute L3 abnormality, there is evidence of tumor progression at L3 causing significant damage and likely causing the  pain. We will consult radiation oncology for palliative pain  control. We will increase fentanyl patch to 125 mcg/h from 100 mcg/h q. 72 hours. Continue oxycodone p.r.n. for breakthrough pain along with IV morphine p.r.n. for breakthrough pain. We will add Decadron 4 mg b.i.d. Continue to monitor labs. CBC also shows that she is not neutropenic with ANC of 400, likely side effect of Pomalyst. We will therefore hold medication at this time and continue to monitor. Given her frail condition and neutropenia we will give Neupogen 300 mcg today and repeat CBC tomorrow. The patient otherwise seems afebrile at this time. Agree with neutropenic precautions also. Overall prognosis is poor. We will continue to follow. The patient explained above, she is agreeable to this plan.   Thank you for the referral. Please feel free to contact me for additional questions. ____________________________ Rhett Bannister Ma Hillock, MD srp:sb D: 05/31/2013 15:53:08 ET T: 05/31/2013 16:16:26 ET JOB#: [id number]  cc: Kyndel Egger R. Ma Hillock, MD, <Dictator> Alveta Heimlich MD ELECTRONICALLY SIGNED 06/01/2013 15:33

## 2014-06-07 NOTE — Consult Note (Signed)
Reason for Visit: This 74 year old Female patient presents to the clinic for initial evaluation of  multiple myeloma .   Referred by Dr Ma Hillock.  Diagnosis:  Chief Complaint/Diagnosis   76-year-old female with multiple myeloma now with significant mid back and lower back pain with MRI scan showing extensive involvement of the spinal cord and compression of the thecal sac at L3 for palliative radiation therapy  Imaging Report MRI scans reviewed   Referral Report clinical notes reviewed   Planned Treatment Regimen palliative radiation therapy to lower thoracic andlumbar spine   HPI   patient is a 74 year old female diagnosed in 2006 with multiple myeloma status post autologous bone marrow transplantation October 2010 at [facility]. She's had kyphoplasty at T12 recently had pain flareup necessitating admission to [facility] with MRI scan showing extensive metastatic involvement of the entire visualized portion of the spine with tumor extending into the canal and compressing the thecal sac at L3. She was started on narcotic analgesics and discharged and is seen today an emergent basis for consideration of palliative radiation therapy. Patient has a past medical history significant for hypertension, adult onset diabetes colitis.  Past Hx:    Compression Fracture: 2004   Diabetes:    insomnia:    HTN:    multiple myeloma:    port-a-cath placement:   Past, Family and Social History:  Past Medical History positive   Cardiovascular hypertension   Gastrointestinal GERD; colitis, esophageal motility disorder   Endocrine diabetes mellitus   Past Medical History Comments multiple myeloma   Family History positive   Family History Comments history of diabetes and breast cancer   Social History noncontributory   Additional Past Medical and Surgical History accompanied by multiple family members tonight   Allergies:   No Known Allergies:   Home Meds:  Home Medications: Medication  Instructions Status  fentaNYL 25 mcg/hr transdermal film, extended release 1 patch transdermal every 3 days Active  fentaNYL 100 mcg/hr film, extended release 1 patch transdermal every 72 hours Active  oxyCODONE 10 mg oral tablet 1 tab(s) orally every 6 hours, As Needed, pain , As needed, pain Active  prochlorperazine 10 mg oral tablet 1 tab(s) orally 3 times a day, As Needed- for Nausea, Vomiting  Active  Senokot 8.6 mg oral tablet 2 tab(s) orally once a day (at bedtime) Active  Pomalyst 4 mg oral capsule 1 cap(s) orally once a day Active  gabapentin 600 mg oral tablet 2 tabs orally 3 times a day Active  magnesium hydroxide 8% oral suspension 30 milliliter(s) orally once a day (at bedtime), As Needed - for Constipation Active  Anusol-HC 2.5% rectal cream with applicator 1 application rectal 2 times a day, As Needed for hemorrhoids Active  MiraLax - oral powder for reconstitution 17 gram(s) orally 2 times a day, As Needed - for Constipation Active  acyclovir 400 mg oral tablet 1 tab(s) orally 2 times a day Active   Review of Systems:  General negative   Performance Status (ECOG) 0   Skin negative   Breast negative   Ophthalmologic negative   ENMT negative   Respiratory and Thorax negative   Cardiovascular negative   Gastrointestinal see HPI   Genitourinary negative   Musculoskeletal see HPI   Neurological negative   Psychiatric negative   Hematology/Lymphatics negative   Endocrine see HPI   Allergic/Immunologic see HPI   Review of Systems   review of systems obtained from nurses notes  Physical Exam:  General/Skin/HEENT:  Skin normal  Eyes normal   ENMT normal   Head and Neck normal   Additional PE well-developed thin female in NAD. She is in moderate pain discomfort. Lungs are clear to A&P cardiac examination shows regular rate and rhythm abdomen is benign. There is some decreased strength noted in her lower extremities is probably related to her pain  threshold. Range of motion of her lower extremities does elicit the pain more accentuated on the right side. Proprioception is intact. No sensory level is identified.   Breasts/Resp/CV/GI/GU:  Respiratory and Thorax normal   Cardiovascular normal   Gastrointestinal normal   Genitourinary normal   MS/Neuro/Psych/Lymph:  Musculoskeletal normal   Psychiatric normal   Lymphatics normal   Other Results:  Radiology Results: LabUnknown:    17-Apr-15 11:33, MRI Lumbar Spine Without Contrast  PACS Image   MRI:    02-Oct-12 10:21, MRI Thoracic Spine Without Contrast  MRI Thoracic Spine Without Contrast   REASON FOR EXAM:    persistent severe radicular pain neck pain hx of   myeloma eval for disk disea...  COMMENTS:       PROCEDURE: MMR - MMR THORACIC SPINE WO  - Nov 16 2010 10:21AM     RESULT: Sagittal and axial T1 and T2 weighted images were obtained   through the thoracic spine. The patient has undergone previous   kyphoplasty at T12 which is a site of partial compression anteriorly with   loss of height of approximately 25%. Comparison is made to plain films   from 30 September 2010.     The marrow signal of the cervical vertebral bodies is preserved. No   infiltrative processes are suspected. At no level is there evidence of   spinal canal stenosis. The neural foramina appear patent. I see no     abnormal paravertebral soft tissue signal masses. Signal within the   thoracic spinal cord appears normal. The conus medullaris appears to   terminate posterior to the body of L1.    At T11-T12 on the left there is mild encroachment upon the neural foramen   due to lateral disc bulging.    IMPRESSION:    1. The patient has undergone previous kyphoplasty at T12. When compared   to the plain film dated 30 September 2010 which was obtained after the   kyphoplasty procedure, anterior wedge compression of the T12 vertebral   body has occurred with loss of height of approximately 25%.  This reflects   a change since 30 September 2010.  2. At the T11-T12 level there does appear to be mild encroachment upon   the left neural foramen due to lateral disc bulging.    Verified By: DAVID A. Martinique, M.D., MD    17-Apr-15 11:33, MRI Lumbar Spine Without Contrast  MRI Lumbar Spine Without Contrast   REASON FOR EXAM:    back pain  COMMENTS:       PROCEDURE: MR  - MR LUMBAR SPINE WO CONTRAST  - May 31 2013 11:33AM     CLINICAL DATA:  Low back pain. History of multiple myeloma. History  of spinal compression fractures.    EXAM:  MRI LUMBAR SPINE WITHOUT CONTRAST    TECHNIQUE:  Multiplanar, multisequence MR imaging was performed. No intravenous  contrast was administered.  COMPARISON:  Radiographs dated 05/30/2013, CT scan abdomen  03/28/2013, and lumbar MRI dated 06/15/2005    FINDINGS:  Normalconus tip at L1. Multiple metastatic lesions are noted in the  liver.    Prior metastatic lesions  involving the entire visualized portion of  the spine from T10 through the second sacral segment.    Tumor protrudes from the posterior aspect of the L3vertebra into  the spinal canal. The tumor in the canal measures 2.4 x 1.6 x 0.9  cm. The thecal sac is markedly compressed. The tumor slightly  asymmetric to the right. This should affect the L3 nerves.  There are no appreciable acute pathologic fractures. There are old  compression fractures of T12 and L3 treated with vertebroplasty.  Tumor also involves both of those bones. There is a focal Schmorl's  node in the posterior superior aspect of L2 with slight protrusion  of bone into the spinal canal without focal neural impingement.    There is grade 1 spondylolisthesis of L4 on L5 due to is moderately  severe facet arthritis. No focal neural impingement.     IMPRESSION:  1. Metastatic disease involving the entire visualized portion of the  spine with tumor extending into the spinal canal compressing the  thecal sac at L3.  2. No  acute pathologic fractures.  3. Given the pattern of extensive hepatic metastases in addition to  the bone lesions, the possibility of a second primary tumor in  addition to the known myeloma should be considered.      Electronically Signed    By: Rozetta Nunnery M.D.    On: 05/31/2013 11:47         Verified By: Larey Seat, M.D.,   Relevent Results:   Relevant Scans and Labs MRI scans of the thoracic and lumbar spine are reviewed   Assessment and Plan: Impression:   widespread spinal involvement of multiple myeloma in 74 year old female. Plan:   at this time am concerned about the entire lumbar spine. Alike including prior area of T12 kyphoplasty as well as her lumbar spine. Would treat to 2500 cGy in 10 fractions. I would decrease the dose based on her prior history of colitis and large field involved. Believe that there should be sufficient to palliate myelomatous involvement of the involved lumbar vertebra. Risks and benefits of treatment including colitis, diarrhea, possibly urinary frequency and urgency, skin reaction, fatigue, and possible alteration of blood counts all were discussed in detail with the patient and her family. I consider her for an emergent simulation today and will start treatment later this week.  I would like to take this opportunity for allowing me to participate in the care of your patient..  CC Referral:  cc: Dr. Otilio Miu   Electronic Signatures: Baruch Gouty, Roda Shutters (MD)  (Signed 20-Apr-15 12:08)  Authored: HPI, Diagnosis, Past Hx, PFSH, Allergies, Home Meds, ROS, Physical Exam, Other Results, Relevent Results, Encounter Assessment and Plan, CC Referring Physician   Last Updated: 20-Apr-15 12:08 by Armstead Peaks (MD)

## 2014-06-07 NOTE — Discharge Summary (Signed)
PATIENT NAME:  Joyce Schultz, TUDOR MR#:  [id number] DATE OF BIRTH:  1940-07-13  DATE OF ADMISSION:  03/29/2013  DATE OF DISCHARGE:  04/10/2013  ADDENDUM  This is an addendum to earlier dictated discharge summary by Dr. Tressia Miners, that was done on 04/03/2013.    FINAL DIAGNOSES: 1.  Acute respiratory distress.  2.  Acute diastolic congestive heart failure.  3.  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 4.  Myeloma.  5.  Acute renal failure, improved.  6.  Chronic pain due to cancer.  7.  Pleural effusion, may be due to congestive heart failure versus malignancy.  8.  Gout.  CODE STATUS ON DISCHARGE:  FULL CODE.   MEDICATIONS ON DISCHARGE: 1.  Prochlorperazine 10 mg 3 times a day as needed for nausea and vomiting.  2.  Pomalyst 4 mg oral capsule once a day.  3.  Gabapentin 600 mg tablet 2 tablets oral 3 times a day.  4.  Fentanyl 100 mcg transdermal patch every 72 hours.  5.  Magnesium hydroxide 8% oral 30 mL as needed for constipation.  6.  Prednisone 10 mg tablet, start at 60 and taper by 10 mg daily until complete.  7.  Acetaminophen and oxycodone 1 tablet 3 to 4 times a day as needed for pain.  8.  Calcium and vitamin D 2 times a day with meals. Sandeep R. Ma Hillock, MD  HOME OXYGEN ON DISCHARGE:  Yes.  Oxygen delivery at home, 2 liters nasal cannula.   DIET:  Low-sodium diet, advised to have a regular consistency diet.   FOLLOWUP:  Advised to follow within 1 to 2 weeks with Dr. Ma Hillock for biopsy and further planning of chemotherapy.  Also suggested to check kidney function with oncology in 1 to 2 weeks.   For history of presenting illness and earlier course in hospital, please see interim discharge summary on the 18th of February.   HOSPITAL COURSE AFTER 18TH OF FEBRUARY:   1.  Acute renal failure.  Renal function improved and creatinine came down to 1.4 to 1.3 level.  She was advised to follow it in clinic with Dr. Ma Hillock.  2.  Metabolic alkalosis.  This was likely due to renal failure  and dehydration.  We gave some IV fluids and with that there was improvement in the alkalosis.  3.  Acute respiratory distress.  The patient was hypoxic requiring 2 liters oxygen.  Chest x-ray was showing some pleural effusion bilaterally.  Most likely it was due to renal failure issues.  Oxygen saturation was probably not related to activity on exertion and so we decided to discharge home with oxygen.  4.  New breast mass.  Breast biopsy was done while the patient was in the hospital and we advised to follow with Dr. Ma Hillock in clinic for results and further planning of this.  5.  Myeloma.  The patient was following with Dr. Ma Hillock, so advised to continue following after discharge.  6.  Compression fracture of vertebra.  The patient had some complaint of lower back pain and back x-ray showed some compression fracture of vertebra.  Orthopedic consult was called in and they suggested just to give symptomatic treatment with steroid and pain management as there was no compression and we did that.   IMPORTANT LABORATORY RESULTS IN THE HOSPITAL:  On 19th of February, creatinine was 1.98, sodium 139 and potassium 3.9.    Echocardiogram was done which showed left ventricular ejection fraction 55% to 60%, small-to-moderate-sized pleural effusion noted on  the left, impaired relaxation of ventricular diastolic filling, mild dilated right ventricle.    Creatinine gradually improved, on the 22nd of February, creatinine was 1.7.  Bicarb level went up to 40.  On 23rd of February, on ABG, pH was 7.5, pCO2 was 59 and pO2 was 81 on 28% oxygen.  Creatinine came to 1.38 on the 24th of February and bicarb level improved to 36.  Creatinine was 1.42 on 25th of February and bicarb level 33.   Total time spent in this discharge: 40 minutes.    ____________________________ Ceasar Lund Anselm Jungling, MD vgv:ea D: 04/14/2013 00:30:00 ET T: 04/14/2013 17:15:19 ET JOB#: [id number]  cc: Sandeep R. Ma Hillock, MD   Vaughan Basta MD ELECTRONICALLY SIGNED 04/22/2013 7:52

## 2014-06-09 DIAGNOSIS — Z0000 Encounter for general adult medical examination without abnormal findings: Secondary | ICD-10-CM | POA: Diagnosis not present

## 2014-06-09 DIAGNOSIS — Z1389 Encounter for screening for other disorder: Secondary | ICD-10-CM | POA: Diagnosis not present

## 2014-06-12 DIAGNOSIS — C9002 Multiple myeloma in relapse: Secondary | ICD-10-CM | POA: Diagnosis not present

## 2014-06-12 DIAGNOSIS — Z79899 Other long term (current) drug therapy: Secondary | ICD-10-CM | POA: Diagnosis not present

## 2014-06-12 DIAGNOSIS — Z9481 Bone marrow transplant status: Secondary | ICD-10-CM | POA: Diagnosis not present

## 2014-06-12 DIAGNOSIS — D649 Anemia, unspecified: Secondary | ICD-10-CM | POA: Diagnosis not present

## 2014-06-12 DIAGNOSIS — M5030 Other cervical disc degeneration, unspecified cervical region: Secondary | ICD-10-CM | POA: Diagnosis not present

## 2014-06-12 DIAGNOSIS — R630 Anorexia: Secondary | ICD-10-CM | POA: Diagnosis not present

## 2014-06-12 DIAGNOSIS — E119 Type 2 diabetes mellitus without complications: Secondary | ICD-10-CM | POA: Diagnosis not present

## 2014-06-12 DIAGNOSIS — E876 Hypokalemia: Secondary | ICD-10-CM | POA: Diagnosis not present

## 2014-06-12 DIAGNOSIS — I10 Essential (primary) hypertension: Secondary | ICD-10-CM | POA: Diagnosis not present

## 2014-06-12 DIAGNOSIS — K529 Noninfective gastroenteritis and colitis, unspecified: Secondary | ICD-10-CM | POA: Diagnosis not present

## 2014-06-12 DIAGNOSIS — R42 Dizziness and giddiness: Secondary | ICD-10-CM | POA: Diagnosis not present

## 2014-06-12 LAB — CBC CANCER CENTER
BASOS PCT: 0.3 %
Basophil #: 0 x10 3/mm (ref 0.0–0.1)
Eosinophil #: 0 x10 3/mm (ref 0.0–0.7)
Eosinophil %: 0.1 %
HCT: 25.7 % — ABNORMAL LOW (ref 35.0–47.0)
HGB: 8.7 g/dL — ABNORMAL LOW (ref 12.0–16.0)
Lymphocyte #: 1.6 x10 3/mm (ref 1.0–3.6)
Lymphocyte %: 43.6 %
MCH: 33.6 pg (ref 26.0–34.0)
MCHC: 33.8 g/dL (ref 32.0–36.0)
MCV: 99 fL (ref 80–100)
Monocyte #: 0.2 x10 3/mm (ref 0.2–0.9)
Monocyte %: 6.5 %
NEUTROS PCT: 49.5 %
Neutrophil #: 1.8 x10 3/mm (ref 1.4–6.5)
PLATELETS: 174 x10 3/mm (ref 150–440)
RBC: 2.59 10*6/uL — AB (ref 3.80–5.20)
RDW: 17 % — ABNORMAL HIGH (ref 11.5–14.5)
WBC: 3.6 x10 3/mm (ref 3.6–11.0)

## 2014-06-12 LAB — BASIC METABOLIC PANEL
Anion Gap: 7 (ref 7–16)
BUN: 13 mg/dL
CREATININE: 0.85 mg/dL
Calcium, Total: 8.6 mg/dL — ABNORMAL LOW
Chloride: 92 mmol/L — ABNORMAL LOW
Co2: 34 mmol/L — ABNORMAL HIGH
EGFR (African American): >60
GLUCOSE: 129 mg/dL — AB
POTASSIUM: 3.9 mmol/L
Sodium: 133 mmol/L — ABNORMAL LOW

## 2014-06-12 LAB — HEPATIC FUNCTION PANEL A (ARMC)
ALT: 31 U/L
AST: 38 U/L
Albumin: 2.5 g/dL — ABNORMAL LOW
Alkaline Phosphatase: 181 U/L — ABNORMAL HIGH
Bilirubin, Direct: <0.1 mg/dL
Bilirubin,Total: 0.5 mg/dL
Total Protein: 8.8 g/dL — ABNORMAL HIGH

## 2014-06-12 LAB — PHOSPHORUS: PHOSPHORUS: 3.6 mg/dL

## 2014-06-12 LAB — MAGNESIUM: Magnesium: 1.8 mg/dL

## 2014-06-13 DIAGNOSIS — Z9481 Bone marrow transplant status: Secondary | ICD-10-CM | POA: Diagnosis not present

## 2014-06-13 DIAGNOSIS — Z79899 Other long term (current) drug therapy: Secondary | ICD-10-CM | POA: Diagnosis not present

## 2014-06-13 DIAGNOSIS — M5030 Other cervical disc degeneration, unspecified cervical region: Secondary | ICD-10-CM | POA: Diagnosis not present

## 2014-06-13 DIAGNOSIS — K529 Noninfective gastroenteritis and colitis, unspecified: Secondary | ICD-10-CM | POA: Diagnosis not present

## 2014-06-13 DIAGNOSIS — D649 Anemia, unspecified: Secondary | ICD-10-CM | POA: Diagnosis not present

## 2014-06-13 DIAGNOSIS — C9002 Multiple myeloma in relapse: Secondary | ICD-10-CM | POA: Diagnosis not present

## 2014-06-13 DIAGNOSIS — R630 Anorexia: Secondary | ICD-10-CM | POA: Diagnosis not present

## 2014-06-13 DIAGNOSIS — E119 Type 2 diabetes mellitus without complications: Secondary | ICD-10-CM | POA: Diagnosis not present

## 2014-06-13 DIAGNOSIS — R42 Dizziness and giddiness: Secondary | ICD-10-CM | POA: Diagnosis not present

## 2014-06-13 DIAGNOSIS — I10 Essential (primary) hypertension: Secondary | ICD-10-CM | POA: Diagnosis not present

## 2014-06-13 DIAGNOSIS — E876 Hypokalemia: Secondary | ICD-10-CM | POA: Diagnosis not present

## 2014-06-15 NOTE — Discharge Summary (Signed)
PATIENT NAME:  Joyce Schultz, Joyce Schultz MR#:  [id number] DATE OF BIRTH:  Feb 05, 1941  DATE OF ADMISSION:  03/20/2014 DATE OF DISCHARGE:  03/21/2014  DISCHARGE DIAGNOSES: 1. Pathological fracture of the right humerus.  2. Pancytopenia.  3. Chronic pain syndrome.  4. Multiple myeloma.   CONSULTS: Dr. Roland Rack with orthopedics and Dr. Ma Hillock with oncology.   DISCHARGE MEDICATIONS: 1. Senokot 8.6 mg 2 tablets oral once a day at bedtime as needed.  2. Tizanidine 2 mg oral every 8 hours.  3. Fentanyl 100 mcg patch 2 patches transdermal every 3 days.  4. Morphine 20 mg/mL 0.5 to 1 mL every 1 to 2 hours as needed for pain.  5. Potassium chloride 20 mEq daily.  6. Magnesium oxide 400 mg daily.  7. Acyclovir 400 mg oral 2 times a day.   DISCHARGE INSTRUCTIONS: Regular food, regular consistency. Activity as tolerated with a sling for the right arm. Follow up with Dr. Roland Rack of orthopedics and Dr. Ma Hillock of oncology in a week.   IMAGING STUDIES: Include an x-ray of the right humerus, which showed humerus neck fracture, pathological.   ADMITTING HISTORY AND PHYSICAL AND HOSPITAL COURSE: Please see detailed H and P dictated previously. In brief, a 74 year old, African-American female patient with history of multiple myeloma, presented to the hospital complaining of right arm pain after she lifted a heavy bag with the arm. The patient was found to have a right neck humerus fracture and was admitted for pain control. The patient did receive 1 unit of packed RBCs during the hospital stay for her anemia and pain was well controlled by the time of discharge. Was seen by orthopedics who suggested conservative management. Also was seen by oncology. The patient is being discharged home after being seen by PT and not having any needs. Her pain medication will be continued as before.   PHYSICAL EXAMINATION: Prior to discharge, the patient's lungs sound clear. S1, S2 heard. Her right arm is in a sling. Pain is well controlled.    TIME SPENT: On day of discharge and discharge activity was 40 minutes.    ____________________________ Leia Alf Alexandru Moorer, MD srs:TT D: 03/22/2014 16:21:56 ET T: 03/22/2014 16:39:16 ET JOB#: [id number]  cc: Alveta Heimlich R. Sixto Bowdish, MD, <Dictator> Sandeep R. Ma Hillock, MD J. Dorien Chihuahua, MD Juline Patch, MD Neita Carp MD ELECTRONICALLY SIGNED 03/24/2014 3:56

## 2014-06-15 NOTE — Consult Note (Signed)
PATIENT NAME:  Joyce Schultz, Joyce Schultz MR#:  [id number] DATE OF BIRTH:  Nov 26, 1940  DATE OF CONSULTATION:  03/21/2014  REFERRING PHYSICIAN:  Ceasar Lund. Anselm Jungling, MD  CONSULTING PHYSICIAN:  Delorese Sellin R. Ma Hillock, MD  REASON FOR CONSULTATION: Pathologic fracture, right humerus, multiple myeloma.   HISTORY OF PRESENT ILLNESS: The patient is a 74 year old female with a longstanding history of multiple myeloma which has relapsed after multiple treatments. The patient recently had progression of disease and has been started on anti- CD38 agent Darzalex, given IV once every week on Wednesday. The patient also has had multiple bony lesions for myeloma along with back pain. She was admitted to the hospital yesterday after she was found to have a pathologic fracture of the right proximal humerus. The patient states this occurred after she tried to lift a small bag of stuff and denies any falls or trauma. Currently, arm has been immobilized after orthopedics has seen the patient; no surgery is planned. Pain is under control. X-ray, right humerus, showed pathologic fracture of the right humeral neck.   PAST MEDICAL HISTORY AND SURGICAL HISTORY:  1.  Multiple myeloma as described above.  2.  Diabetes.  3.  Hypertension.  4.  GERD.  5.  History of abdominal pain.  6.  Hysterectomy.  7.  Appendectomy.   HOME MEDICATIONS: Roxanol 0.5-1 mL q. 1-2 hours p.r.n. for pain; fentanyl 200 mcg patch q. 72 hours; tizanidine 2 mg q. 8 hours p.r.n.; acyclovir 400 mg b.i.d.; prochlorperazine 10 mg t.i.d. p.r.n.; Senokot 8.6, 2 tablets at bedtime; magnesium hydroxide 30 mL once daily p.r.n. constipation; gabapentin 1200 mg t.i.d.; Anusol HC p.r.n.; MiraLax p.r.n.; Slow-K 20 mEq b.i.d.   FAMILY HISTORY: Remarkable for breast cancer and diabetes.   SOCIAL HISTORY: Nonsmoker. No history of alcohol intake. Lives with her husband.   REVIEW OF SYSTEMS:  CONSTITUTIONAL: Generalized fatigue and weakness is chronic and same. No fever or  chills.  HEENT: No dizziness, epistaxis, ear or jaw pain. No sore throat.  CARDIAC: No angina, palpitation, orthopnea, PND.   LUNGS: Has chronic mild dyspnea on exertion, unchanged. No cough, sputum, or hemoptysis.  GASTROINTESTINAL: No nausea, vomiting, or diarrhea. No blood in stools or melena.  GENITOURINARY: No dysuria or hematuria.  MUSCULOSKELETAL: As in HPI.  EXTREMITIES: No leg swelling or pain.  NEUROLOGIC: No new focal weakness, falls, or seizures.  ENDOCRINE: No polyuria or polydipsia.   PHYSICAL EXAMINATION: GENERAL: The patient is a chronically weak and frail-looking, otherwise alert and oriented, in no acute distress, converses appropriately.  HEENT: No icterus. Pallor present.  VITAL SIGNS: Temperature 98, heart rate 81, respirations 18, blood pressure 105/63, oxygen saturation 95% on room air.  HEENT: Normocephalic, atraumatic. Extraocular movements intact. Sclerae anicteric.  NECK: Negative for lymphadenopathy. CVS:  S1, S2, regular rhythm.  LUNGS: Show bilateral good air entry, no rhonchi.  ABDOMEN: Soft, nontender. No hepatomegaly.  EXTREMITIES: Right upper extremity in a support/sling. No pedal edema.   LABORATORY RESULTS: WBC 3600, ANC 2800, hemoglobin 9.0, platelets 75,000. Creatinine 1.1, calcium 8.6.   IMPRESSION AND RECOMMENDATIONS: A 74 year old female patient with a longstanding history of multiple myeloma, more recently had progression of disease and started on anti-CD38 agent Darzalex. The patient has been admitted with a pathologic fracture of the right proximal humerus. She has been seen by orthopedics and the right upper extremity has been immobilized and is in a support/sling; no surgery is planned or recommended. Blood counts show mild anemia and thrombocytopenia, which is expected for her situation  for myeloma and also ongoing treatment. She does not have bleeding symptoms. Regarding pathologic fracture, we will discuss with radiation oncologist, Dr.  Baruch Gouty, on Monday about considering radiation therapy to this area for palliation. No major pain issues otherwise. If patient is discharged soon, she was advised to keep followup appointments at [facility] the same as before.   Thank you for the referral. Please feel free to contact me if any additional questions.    ____________________________ Rhett Bannister Ma Hillock, MD srp:ST D: 03/21/2014 23:03:24 ET T: 03/21/2014 23:23:29 ET JOB#: [id number]  cc: Trevor Iha R. Ma Hillock, MD, <Dictator> Alveta Heimlich MD ELECTRONICALLY SIGNED 03/22/2014 8:42

## 2014-06-15 NOTE — Consult Note (Signed)
PATIENT NAME:  Joyce Schultz, PALMA MR#:  [id number] DATE OF BIRTH:  05-04-1940  DATE OF CONSULTATION:  03/20/2014  REFERRING PHYSICIAN: Brunilda Payor A. Edd Fabian, MD   CONSULTING PHYSICIAN:  Pascal Lux, MD  REASON FOR CONSULTATION: I have been asked by Dr. Edd Fabian to evaluate this unfortunate woman for right shoulder pain.   HISTORY OF PRESENT ILLNESS: Briefly, she is a 74 year old female with a history of multiple myeloma for which she presently is receiving chemotherapy, who apparently was trying to lift an object yesterday afternoon when she felt pain in her shoulder. She continued to have pain through the night and so presented to [facility] earlier today where x-rays demonstrated a minimally displaced surgical neck fracture of the proximal humerus. It also appears to be a pathologic fracture through a multiple myeloma lesion. She was sent to the Emergency Room for more definitive management of her injury.   The patient denies any numbness or paresthesias to her hand nor does she note any associated injuries. She had no lightheadedness, loss of consciousness, chest pain, or shortness of breath that may have precipitated the injury.   PAST MEDICAL HISTORY: As noted above. She also has a history of hypertension and diabetes, as well as insomnia.   PHYSICAL EXAMINATION: GENERAL: We have a pleasant, thin, elderly female appearing older than her stated age. She is alert and oriented x 3 and appears to be resting comfortably in bed.  ORTHOPEDIC: Limited to the right upper extremity. Skin inspection around the shoulder is unremarkable. She has mild tenderness to light palpation over the lateral aspect of the shoulder in the area of the deltoid. She has more moderate pain with any attempt at active or passive motion of the shoulder. She is able to actively flex and extend all digits and has intact sensation to light touch to all distributions, including the axillary nerve distribution. She has good capillary  refill to all digits.   RADIOLOGIC DATA: X-ray data are as noted above.   IMPRESSION: Minimally displaced pathologic fracture of right proximal humerus.   PLAN: The treatment options are discussed with the patient. The patient is being admitted at this time for other medical issues, including a low hematocrit. Certainly, this fracture could be managed nonsurgically at this time. Therefore, she is to be placed in a shoulder immobilizer and given pain medication as needed for comfort while her other medical issues are treated. It is unlikely that she will require surgical intervention for this, but may require radiation therapy to the lesion if indicated clinically.   Thank you for asking me to participate in the care of this most unfortunate woman. I will be happy to follow her with you.    ____________________________ J. Dorien Chihuahua, MD jjp:TT D: 03/20/2014 19:16:24 ET T: 03/20/2014 19:36:03 ET JOB#: [id number]  cc: Pascal Lux, MD, <Dictator> Pascal Lux MD ELECTRONICALLY SIGNED 03/25/2014 14:59

## 2014-06-15 NOTE — H&P (Signed)
PATIENT NAME:  Joyce Schultz, Joyce Schultz MR#:  [id number] DATE OF BIRTH:  April 13, 1940  DATE OF ADMISSION:  03/20/2014  PRIMARY CARE PHYSICIAN: Juline Patch, MD   PRIMARY ONCOLOGIST: Rhett Bannister. Ma Hillock, MD  REFERRING EMERGENCY ROOM PHYSICIAN: Verdia Kuba. Paduchowski, MD    CHIEF COMPLAINT: Right shoulder pain.   HISTORY OF PRESENT ILLNESS: A 74 year old female who has a history of hypertension and diabetes, does not take any medication, has multiple myeloma with metastases and recurrence. She recently started on chemotherapy by Dr. Ma Hillock. History of diastolic congestive heart failure. Goes to Dr. Beverly Gust office in [facility] for chemotherapy every Wednesday. Yesterday, in the afternoon, she was trying to lift a small bag and she suddenly felt a pop in her right shoulder. She tried to relax after that and there was some pain. The pain did not go away and so by evening it did not get relieved and so today, morning, she called Dr. Beverly Gust office. He suggested to urgent care center, where she went and her x-ray was done, which showed there is right humerus fracture and so if she is seen by an orthopedic doctor in the hospital who suggested to just give her a sling and admit to medical services for further management. Hospitalist service was contacted for that.   REVIEW OF SYSTEMS:  CONSTITUTIONAL: Negative for fever, fatigue, weakness, pain, or weight loss.  EYES: No blurring, double vision, discharge, or redness.  EARS, NOSE, THROAT: No tinnitus, ear pain, or hearing loss.  RESPIRATORY: No cough, wheezing, hemoptysis, or shortness of breath.  CARDIOVASCULAR: No chest pain, orthopnea, edema, arrhythmia, palpitation.  GASTROINTESTINAL: No nausea, vomiting, diarrhea, abdominal pain.  GENITOURINARY: No dysuria, hematuria, or increased frequency.  ENDOCRINE: No heat or cold intolerance. No excessive sweating.  SKIN: No acne, rashes, or lesions.  MUSCULOSKELETAL: No pain or swelling in the joints except right  arm and shoulder.  NEUROLOGICAL: No numbness, weakness, tremor, or vertigo.  PSYCHIATRIC: No anxiety, insomnia, bipolar disorder.   PAST MEDICAL HISTORY:  1.  Hypertension and diabetes, but not on any medication. 2.  Gastroesophageal reflux disease.  3.  History of right-sided abdominal pain.  4.  Diastolic congestive heart failure.  5.  Pancytopenia related to multiple myeloma.   PAST SURGICAL HISTORY: Status post hysterectomy and appendectomy.   FAMILY HISTORY: Breast cancer and diabetes.   SOCIAL HISTORY: Does not smoke. No alcohol use. Lives with husband and uses cane to walk.    HOME MEDICATIONS:  1.  Tizanidine 2 mg oral tablet every 8 hours as needed.  2.  Senokot 2 tablets once a day for constipation as needed.  3.  Morphine 20 mg/mL oral take 1 mL every 1-2 hours as needed for pain.  4.  Magnesium oxide 400 mg once a day.  5.  KCl 20 mEq once a day.  6.  Fentanyl 100 mcg/hour patch, take 2 patches every 72 hours.  7.  Acyclovir 400 mg oral 2 times a day.   PHYSICAL EXAMINATION:  VITAL SIGNS: Temperature 96, pulse is 98, respirations 16, blood pressure 159/77, and pulse oximetry is 95% on room air.  GENERAL: The patient is fully alert and oriented to time, place, and person. Has slight pain on the right shoulder but not any other distress.  HEENT: Head and neck atraumatic. Conjunctivae are pink. Oral mucosa moist.  NECK: Supple. Thyroid nontender. No JVD.  RESPIRATORY: Bilateral equal and clear air entry. No crepitation or wheezing.  CARDIOVASCULAR: S1, S2 present, regular. No murmur.  ABDOMEN: Soft, nontender. Bowel sounds present. No organomegaly.  SKIN: No acne, rashes, or lesions.  MUSCULOSKELETAL: No pain or swelling in the joints.  NEUROLOGICAL: No numbness. Power 5/5, except not moving right upper extremity because of pain or fracture. No tremor or rigidity. Follows commands.  RIGHT UPPER EXTREMITY: There is pain on local palpation around the right shoulder and  arm. Sensation and pulsation intact distally.  LEGS: No edema.  PSYCHIATRIC: Does not appear in acute psychiatric illness at this time.   IMPORTANT LABORATORY RESULTS:  1.  Glucose 109, BUN 11, creatinine 1.23, sodium 136, potassium 3.3, chloride 101, CO2 of 32, and calcium is 8.7.  2.  Total protein is 12.1, bilirubin 0.4, alkaline phosphate 107, SGOT 77, and SGPT 34.  3.  Troponin less than 0.02.  4.  WBC 3.6, hemoglobin is 7.1, platelet count is 88,000, and MCV is 98.  5.  INR is 1.2.  6.  Urinalysis is grossly negative  7.  Right shoulder x-ray shows mild displaced oblique fracture involving proximal right humerus. Concerning for pathologic fracture. Presence of a lucent lesion in the area and history of multiple myeloma.   ASSESSMENT AND PLAN: A 74 year old female with a past medical history of multiple myeloma with metastases came into the hospital with a right humerus fracture.  1.  Right humerus fracture: Orthopedic doctor saw the patient and suggested conservative management. No surgery. We will continue the patient fentanyl patch, as she is taking at home and we will give morphine injection for further pain. The patient might need physical therapy and occupational therapy involvement for her fracture.  2.  Multiple myeloma: We will call oncology consult for this issue, as she presented with a pathologic fracture now.  3.  Pancytopenia: This is because of her multiple myeloma. She is ordered a blood transfusion by Emergency Room physician. She follows with [place] regularly, and for further intervention I would leave it up to Dr. Ma Hillock to handle that.  4.  Hypertension: We will give metoprolol for now. Part of that it is because of her pain also.   CODE STATUS: Full code.   TOTAL TIME SPENT ON THIS ADMISSION: 50 minutes.    ____________________________ Ceasar Lund Anselm Jungling, MD vgv:bm D: 03/21/2014 01:25:00 ET T: 03/21/2014 02:47:52 ET JOB#: [id number]  cc: Ceasar Lund. Anselm Jungling, MD, <Dictator> Sandeep R. Ma Hillock, MD Juline Patch, MD Rosalio Macadamia [facility] MD ELECTRONICALLY SIGNED 04/02/2014 15:52

## 2014-06-16 DIAGNOSIS — M25511 Pain in right shoulder: Secondary | ICD-10-CM | POA: Diagnosis not present

## 2014-06-16 DIAGNOSIS — S42291D Other displaced fracture of upper end of right humerus, subsequent encounter for fracture with routine healing: Secondary | ICD-10-CM | POA: Diagnosis not present

## 2014-06-18 DIAGNOSIS — Z1331 Encounter for screening for depression: Secondary | ICD-10-CM | POA: Insufficient documentation

## 2014-06-18 DIAGNOSIS — Z0000 Encounter for general adult medical examination without abnormal findings: Secondary | ICD-10-CM | POA: Insufficient documentation

## 2014-06-18 DIAGNOSIS — K529 Noninfective gastroenteritis and colitis, unspecified: Secondary | ICD-10-CM | POA: Insufficient documentation

## 2014-06-18 DIAGNOSIS — Z859 Personal history of malignant neoplasm, unspecified: Secondary | ICD-10-CM | POA: Insufficient documentation

## 2014-06-18 DIAGNOSIS — F339 Major depressive disorder, recurrent, unspecified: Secondary | ICD-10-CM | POA: Insufficient documentation

## 2014-06-18 DIAGNOSIS — G629 Polyneuropathy, unspecified: Secondary | ICD-10-CM | POA: Insufficient documentation

## 2014-06-20 ENCOUNTER — Telehealth: Payer: Self-pay | Admitting: *Deleted

## 2014-06-20 NOTE — Telephone Encounter (Signed)
Denies having taken anything for diarrhea, instructed patient to get Imodium AD adn take and if it doe not improve to call us back

## 2014-07-02 ENCOUNTER — Other Ambulatory Visit: Payer: Self-pay

## 2014-07-02 DIAGNOSIS — C9000 Multiple myeloma not having achieved remission: Secondary | ICD-10-CM

## 2014-07-03 ENCOUNTER — Inpatient Hospital Stay: Payer: Medicare Other | Attending: Internal Medicine

## 2014-07-03 ENCOUNTER — Other Ambulatory Visit: Payer: Self-pay | Admitting: *Deleted

## 2014-07-03 DIAGNOSIS — C9002 Multiple myeloma in relapse: Secondary | ICD-10-CM | POA: Insufficient documentation

## 2014-07-03 DIAGNOSIS — Z9481 Bone marrow transplant status: Secondary | ICD-10-CM | POA: Diagnosis not present

## 2014-07-03 DIAGNOSIS — C9000 Multiple myeloma not having achieved remission: Secondary | ICD-10-CM

## 2014-07-03 LAB — CBC WITH DIFFERENTIAL/PLATELET
BASOS ABS: 0 10*3/uL (ref 0.0–0.1)
Band Neutrophils: 0 % (ref 0–10)
Basophils Relative: 0 % (ref 0–1)
Blasts: 0 %
EOS ABS: 0 10*3/uL (ref 0.0–0.7)
Eosinophils Relative: 0 % (ref 0–5)
HCT: 25.9 % — ABNORMAL LOW (ref 35.0–47.0)
Hemoglobin: 8.6 g/dL — ABNORMAL LOW (ref 12.0–16.0)
LYMPHS ABS: 2.1 10*3/uL (ref 0.7–4.0)
Lymphocytes Relative: 58 % — ABNORMAL HIGH (ref 12–46)
MCH: 33.3 pg (ref 26.0–34.0)
MCHC: 33.2 g/dL (ref 32.0–36.0)
MCV: 100.2 fL — ABNORMAL HIGH (ref 80.0–100.0)
METAMYELOCYTES PCT: 2 %
MONO ABS: 0.2 10*3/uL (ref 0.1–1.0)
MONOS PCT: 7 % (ref 3–12)
MYELOCYTES: 0 %
NEUTROS ABS: 1.2 10*3/uL — AB (ref 1.7–7.7)
NEUTROS PCT: 33 % — AB (ref 43–77)
PLATELETS: 81 10*3/uL — AB (ref 150–440)
Promyelocytes Absolute: 0 %
RBC: 2.58 MIL/uL — AB (ref 3.80–5.20)
RDW: 17.9 % — ABNORMAL HIGH (ref 11.5–14.5)
WBC: 3.5 10*3/uL — AB (ref 3.6–11.0)
nRBC: 0 /100 WBC

## 2014-07-03 LAB — BASIC METABOLIC PANEL
Anion gap: 8 (ref 5–15)
BUN: 11 mg/dL (ref 6–20)
CO2: 34 mmol/L — ABNORMAL HIGH (ref 22–32)
CREATININE: 0.74 mg/dL (ref 0.44–1.00)
Calcium: 7.7 mg/dL — ABNORMAL LOW (ref 8.9–10.3)
Chloride: 92 mmol/L — ABNORMAL LOW (ref 101–111)
GLUCOSE: 136 mg/dL — AB (ref 65–99)
POTASSIUM: 3.1 mmol/L — AB (ref 3.5–5.1)
Sodium: 134 mmol/L — ABNORMAL LOW (ref 135–145)

## 2014-07-03 LAB — URINALYSIS COMPLETE WITH MICROSCOPIC (ARMC ONLY)
Bilirubin Urine: NEGATIVE
GLUCOSE, UA: NEGATIVE mg/dL
Hgb urine dipstick: NEGATIVE
Ketones, ur: NEGATIVE mg/dL
Nitrite: NEGATIVE
PH: 7 (ref 5.0–8.0)
Specific Gravity, Urine: 1.01 (ref 1.005–1.030)

## 2014-07-03 LAB — HEPATIC FUNCTION PANEL
ALK PHOS: 155 U/L — AB (ref 38–126)
ALT: 23 U/L (ref 14–54)
AST: 43 U/L — ABNORMAL HIGH (ref 15–41)
Albumin: 2.6 g/dL — ABNORMAL LOW (ref 3.5–5.0)
Bilirubin, Direct: <0.1 mg/dL — ABNORMAL LOW (ref 0.1–0.5)
TOTAL PROTEIN: 7.7 g/dL (ref 6.5–8.1)
Total Bilirubin: 0.6 mg/dL (ref 0.3–1.2)

## 2014-07-03 LAB — PHOSPHORUS: Phosphorus: 3.5 mg/dL (ref 2.5–4.6)

## 2014-07-03 LAB — MAGNESIUM: MAGNESIUM: 1.5 mg/dL — AB (ref 1.7–2.4)

## 2014-07-03 NOTE — Progress Notes (Signed)
Informed patient that her serum mag is low and I will call in Mag Oxide 400mg  1 tab PO qd x 7 days to [facility].

## 2014-07-03 NOTE — Progress Notes (Signed)
Called patient and informed her that her UA is abnormal and she needs antibiotic. Called in Levaquin 250mg  1 tab PO qd x 7 days with no refills [facility]. Also informed patient that her potassium is low and we are calling in [place] 9meq qd x 7 days. [facility] [place].

## 2014-07-05 LAB — URINE CULTURE

## 2014-07-19 DIAGNOSIS — E119 Type 2 diabetes mellitus without complications: Secondary | ICD-10-CM | POA: Diagnosis not present

## 2014-07-23 ENCOUNTER — Other Ambulatory Visit: Payer: Self-pay

## 2014-07-23 DIAGNOSIS — C9000 Multiple myeloma not having achieved remission: Secondary | ICD-10-CM

## 2014-07-24 ENCOUNTER — Inpatient Hospital Stay: Payer: Medicare Other

## 2014-07-24 ENCOUNTER — Inpatient Hospital Stay: Payer: Medicare Other | Attending: Internal Medicine

## 2014-07-24 DIAGNOSIS — C9000 Multiple myeloma not having achieved remission: Secondary | ICD-10-CM | POA: Diagnosis not present

## 2014-07-24 LAB — BASIC METABOLIC PANEL
Anion gap: 6 (ref 5–15)
BUN: 14 mg/dL (ref 6–20)
CHLORIDE: 96 mmol/L — AB (ref 101–111)
CO2: 30 mmol/L (ref 22–32)
Calcium: 8.1 mg/dL — ABNORMAL LOW (ref 8.9–10.3)
Creatinine, Ser: 0.99 mg/dL (ref 0.44–1.00)
GFR calc Af Amer: >60 mL/min (ref >60)
GFR calc non Af Amer: 56 mL/min — ABNORMAL LOW (ref >60)
GLUCOSE: 132 mg/dL — AB (ref 65–99)
POTASSIUM: 4.4 mmol/L (ref 3.5–5.1)
SODIUM: 132 mmol/L — AB (ref 135–145)

## 2014-07-24 LAB — HEPATIC FUNCTION PANEL
ALT: 19 U/L (ref 14–54)
AST: 40 U/L (ref 15–41)
Albumin: 2.8 g/dL — ABNORMAL LOW (ref 3.5–5.0)
Alkaline Phosphatase: 137 U/L — ABNORMAL HIGH (ref 38–126)
BILIRUBIN TOTAL: 0.6 mg/dL (ref 0.3–1.2)
Bilirubin, Direct: 0.1 mg/dL (ref 0.1–0.5)
Indirect Bilirubin: 0.5 mg/dL (ref 0.3–0.9)
Total Protein: 8.2 g/dL — ABNORMAL HIGH (ref 6.5–8.1)

## 2014-07-24 LAB — CBC WITH DIFFERENTIAL/PLATELET
BASOS ABS: 0 10*3/uL (ref 0–0.1)
EOS ABS: 0 10*3/uL (ref 0–0.7)
HCT: 23.8 % — ABNORMAL LOW (ref 35.0–47.0)
Hemoglobin: 7.9 g/dL — ABNORMAL LOW (ref 12.0–16.0)
LYMPHS ABS: 2.1 10*3/uL (ref 1.0–3.6)
Lymphocytes Relative: 66 %
MCH: 34.5 pg — ABNORMAL HIGH (ref 26.0–34.0)
MCHC: 33.1 g/dL (ref 32.0–36.0)
MCV: 104.3 fL — ABNORMAL HIGH (ref 80.0–100.0)
Monocytes Absolute: 0.2 10*3/uL (ref 0.2–0.9)
Neutro Abs: 0.9 10*3/uL — ABNORMAL LOW (ref 1.4–6.5)
Neutrophils Relative %: 28 %
PLATELETS: 93 10*3/uL — AB (ref 150–440)
RBC: 2.28 MIL/uL — ABNORMAL LOW (ref 3.80–5.20)
RDW: 20.6 % — ABNORMAL HIGH (ref 11.5–14.5)
WBC: 3.1 10*3/uL — ABNORMAL LOW (ref 3.6–11.0)

## 2014-07-24 LAB — MAGNESIUM: Magnesium: 2.4 mg/dL (ref 1.7–2.4)

## 2014-07-24 LAB — PHOSPHORUS: Phosphorus: 3.6 mg/dL (ref 2.5–4.6)

## 2014-08-07 ENCOUNTER — Other Ambulatory Visit: Payer: Self-pay | Admitting: Internal Medicine

## 2014-08-07 ENCOUNTER — Telehealth: Payer: Self-pay | Admitting: *Deleted

## 2014-08-07 DIAGNOSIS — C9000 Multiple myeloma not having achieved remission: Secondary | ICD-10-CM

## 2014-08-07 MED ORDER — FENTANYL 50 MCG/HR TD PT72: 50.0000 ug | MEDICATED_PATCH | TRANSDERMAL | Status: DC

## 2014-08-07 MED ORDER — FENTANYL 100 MCG/HR TD PT72: 100.0000 ug | MEDICATED_PATCH | TRANSDERMAL | Status: DC

## 2014-08-07 NOTE — Telephone Encounter (Signed)
Error

## 2014-08-13 ENCOUNTER — Other Ambulatory Visit: Payer: Self-pay

## 2014-08-13 DIAGNOSIS — C9000 Multiple myeloma not having achieved remission: Secondary | ICD-10-CM

## 2014-08-14 ENCOUNTER — Inpatient Hospital Stay: Payer: Medicare Other | Attending: Internal Medicine

## 2014-08-14 ENCOUNTER — Other Ambulatory Visit: Payer: Self-pay | Admitting: Internal Medicine

## 2014-08-14 DIAGNOSIS — C9002 Multiple myeloma in relapse: Secondary | ICD-10-CM | POA: Diagnosis not present

## 2014-08-14 DIAGNOSIS — C9000 Multiple myeloma not having achieved remission: Secondary | ICD-10-CM

## 2014-08-14 DIAGNOSIS — Z859 Personal history of malignant neoplasm, unspecified: Secondary | ICD-10-CM

## 2014-08-14 LAB — BASIC METABOLIC PANEL
ANION GAP: 5 (ref 5–15)
BUN: 14 mg/dL (ref 6–20)
CO2: 30 mmol/L (ref 22–32)
CREATININE: 0.86 mg/dL (ref 0.44–1.00)
Calcium: 8 mg/dL — ABNORMAL LOW (ref 8.9–10.3)
Chloride: 95 mmol/L — ABNORMAL LOW (ref 101–111)
GFR calc Af Amer: >60 mL/min (ref >60)
GFR calc non Af Amer: >60 mL/min (ref >60)
Glucose, Bld: 112 mg/dL — ABNORMAL HIGH (ref 65–99)
Potassium: 4.2 mmol/L (ref 3.5–5.1)
Sodium: 130 mmol/L — ABNORMAL LOW (ref 135–145)

## 2014-08-14 LAB — HEPATIC FUNCTION PANEL
ALBUMIN: 2.6 g/dL — AB (ref 3.5–5.0)
ALT: 21 U/L (ref 14–54)
AST: 39 U/L (ref 15–41)
Alkaline Phosphatase: 118 U/L (ref 38–126)
BILIRUBIN DIRECT: 0.1 mg/dL (ref 0.1–0.5)
Indirect Bilirubin: 0.1 mg/dL — ABNORMAL LOW (ref 0.3–0.9)
Total Bilirubin: 0.2 mg/dL — ABNORMAL LOW (ref 0.3–1.2)
Total Protein: 7.8 g/dL (ref 6.5–8.1)

## 2014-08-14 LAB — CBC WITH DIFFERENTIAL/PLATELET
Basophils Absolute: 0 10*3/uL (ref 0–0.1)
Basophils Relative: 0 %
Eosinophils Absolute: 0 10*3/uL (ref 0–0.7)
Eosinophils Relative: 0 %
HCT: 22.4 % — ABNORMAL LOW (ref 35.0–47.0)
HEMOGLOBIN: 7.6 g/dL — AB (ref 12.0–16.0)
LYMPHS ABS: 2 10*3/uL (ref 1.0–3.6)
Lymphocytes Relative: 67 %
MCH: 36.6 pg — ABNORMAL HIGH (ref 26.0–34.0)
MCHC: 34.1 g/dL (ref 32.0–36.0)
MCV: 107.4 fL — ABNORMAL HIGH (ref 80.0–100.0)
Monocytes Absolute: 0.1 10*3/uL — ABNORMAL LOW (ref 0.2–0.9)
Monocytes Relative: 4 %
NEUTROS ABS: 0.9 10*3/uL — AB (ref 1.4–6.5)
Neutrophils Relative %: 29 %
Platelets: 105 10*3/uL — ABNORMAL LOW (ref 150–440)
RBC: 2.08 MIL/uL — ABNORMAL LOW (ref 3.80–5.20)
RDW: 19.5 % — AB (ref 11.5–14.5)
WBC: 3 10*3/uL — ABNORMAL LOW (ref 3.6–11.0)

## 2014-08-14 LAB — PHOSPHORUS: PHOSPHORUS: 3.5 mg/dL (ref 2.5–4.6)

## 2014-08-14 LAB — MAGNESIUM: Magnesium: 2 mg/dL (ref 1.7–2.4)

## 2014-08-14 MED ORDER — MORPHINE SULFATE (CONCENTRATE) 20 MG/ML PO SOLN: ORAL | Status: DC

## 2014-08-15 LAB — KAPPA/LAMBDA LIGHT CHAINS
KAPPA FREE LGHT CHN: 54.72 mg/L — AB (ref 3.30–19.40)
Kappa, lambda light chain ratio: 8.37 — ABNORMAL HIGH (ref 0.26–1.65)
LAMDA FREE LIGHT CHAINS: 6.54 mg/L (ref 5.71–26.30)

## 2014-08-15 LAB — PROTEIN ELECTROPHORESIS, SERUM
A/G RATIO SPE: 0.6 — AB (ref 0.7–1.7)
ALPHA-2-GLOBULIN: 0.6 g/dL (ref 0.4–1.0)
Albumin ELP: 2.8 g/dL — ABNORMAL LOW (ref 2.9–4.4)
Alpha-1-Globulin: 0.3 g/dL (ref 0.0–0.4)
BETA GLOBULIN: 0.7 g/dL (ref 0.7–1.3)
GAMMA GLOBULIN: 3 g/dL — AB (ref 0.4–1.8)
GLOBULIN, TOTAL: 4.6 g/dL — AB (ref 2.2–3.9)
M-SPIKE, %: 2.9 g/dL — AB
TOTAL PROTEIN ELP: 7.4 g/dL (ref 6.0–8.5)

## 2014-08-26 ENCOUNTER — Telehealth: Payer: Self-pay | Admitting: *Deleted

## 2014-08-26 NOTE — Telephone Encounter (Signed)
Per Dr Ma Hillock, will not refill at this time. Patient notified adn acknowledged understanding

## 2014-09-04 ENCOUNTER — Other Ambulatory Visit: Payer: Self-pay | Admitting: *Deleted

## 2014-09-04 ENCOUNTER — Inpatient Hospital Stay: Payer: Medicare Other | Attending: Internal Medicine

## 2014-09-04 ENCOUNTER — Inpatient Hospital Stay: Payer: Medicare Other

## 2014-09-04 ENCOUNTER — Inpatient Hospital Stay (HOSPITAL_BASED_OUTPATIENT_CLINIC_OR_DEPARTMENT_OTHER): Payer: Medicare Other | Admitting: Family Medicine

## 2014-09-04 ENCOUNTER — Ambulatory Visit: Payer: Self-pay | Admitting: Internal Medicine

## 2014-09-04 ENCOUNTER — Other Ambulatory Visit: Payer: Self-pay

## 2014-09-04 DIAGNOSIS — R5383 Other fatigue: Secondary | ICD-10-CM | POA: Diagnosis not present

## 2014-09-04 DIAGNOSIS — C9000 Multiple myeloma not having achieved remission: Secondary | ICD-10-CM

## 2014-09-04 DIAGNOSIS — Z79899 Other long term (current) drug therapy: Secondary | ICD-10-CM | POA: Diagnosis not present

## 2014-09-04 DIAGNOSIS — R0602 Shortness of breath: Secondary | ICD-10-CM | POA: Insufficient documentation

## 2014-09-04 DIAGNOSIS — R5381 Other malaise: Secondary | ICD-10-CM | POA: Insufficient documentation

## 2014-09-04 DIAGNOSIS — R531 Weakness: Secondary | ICD-10-CM

## 2014-09-04 DIAGNOSIS — R1110 Vomiting, unspecified: Secondary | ICD-10-CM | POA: Diagnosis not present

## 2014-09-04 DIAGNOSIS — D649 Anemia, unspecified: Secondary | ICD-10-CM

## 2014-09-04 DIAGNOSIS — Z859 Personal history of malignant neoplasm, unspecified: Secondary | ICD-10-CM

## 2014-09-04 DIAGNOSIS — C9002 Multiple myeloma in relapse: Secondary | ICD-10-CM | POA: Insufficient documentation

## 2014-09-04 LAB — CBC WITH DIFFERENTIAL/PLATELET
Basophils Absolute: 0 K/uL (ref 0–0.1)
Basophils Relative: 0 %
Eosinophils Absolute: 0 K/uL (ref 0–0.7)
Eosinophils Relative: 0 %
HCT: 21.8 % — ABNORMAL LOW (ref 35.0–47.0)
Hemoglobin: 7.3 g/dL — ABNORMAL LOW (ref 12.0–16.0)
Lymphocytes Relative: 66 %
Lymphs Abs: 2.2 K/uL (ref 1.0–3.6)
MCH: 37 pg — ABNORMAL HIGH (ref 26.0–34.0)
MCHC: 33.5 g/dL (ref 32.0–36.0)
MCV: 110.4 fL — ABNORMAL HIGH (ref 80.0–100.0)
Monocytes Absolute: 0.2 K/uL (ref 0.2–0.9)
Monocytes Relative: 6 %
Neutro Abs: 0.9 K/uL — ABNORMAL LOW (ref 1.4–6.5)
Neutrophils Relative %: 28 %
Platelets: 101 K/uL — ABNORMAL LOW (ref 150–440)
RBC: 1.97 MIL/uL — ABNORMAL LOW (ref 3.80–5.20)
RDW: 16.8 % — ABNORMAL HIGH (ref 11.5–14.5)
WBC: 3.3 K/uL — ABNORMAL LOW (ref 3.6–11.0)

## 2014-09-04 LAB — HEPATIC FUNCTION PANEL
ALBUMIN: 2.7 g/dL — AB (ref 3.5–5.0)
ALT: 18 U/L (ref 14–54)
AST: 38 U/L (ref 15–41)
Alkaline Phosphatase: 94 U/L (ref 38–126)
Bilirubin, Direct: <0.1 mg/dL — ABNORMAL LOW (ref 0.1–0.5)
Total Bilirubin: 0.3 mg/dL (ref 0.3–1.2)
Total Protein: 8.5 g/dL — ABNORMAL HIGH (ref 6.5–8.1)

## 2014-09-04 LAB — BASIC METABOLIC PANEL
Anion gap: 6 (ref 5–15)
BUN: 13 mg/dL (ref 6–20)
CO2: 28 mmol/L (ref 22–32)
CREATININE: 0.9 mg/dL (ref 0.44–1.00)
Calcium: 8.1 mg/dL — ABNORMAL LOW (ref 8.9–10.3)
Chloride: 96 mmol/L — ABNORMAL LOW (ref 101–111)
GFR calc non Af Amer: >60 mL/min (ref >60)
Glucose, Bld: 116 mg/dL — ABNORMAL HIGH (ref 65–99)
Potassium: 4.3 mmol/L (ref 3.5–5.1)
SODIUM: 130 mmol/L — AB (ref 135–145)

## 2014-09-04 LAB — PHOSPHORUS: Phosphorus: 3.5 mg/dL (ref 2.5–4.6)

## 2014-09-04 LAB — SAMPLE TO BLOOD BANK

## 2014-09-04 LAB — PREPARE RBC (CROSSMATCH)

## 2014-09-04 LAB — MAGNESIUM: MAGNESIUM: 2 mg/dL (ref 1.7–2.4)

## 2014-09-04 MED ORDER — MORPHINE SULFATE (CONCENTRATE) 20 MG/ML PO SOLN: ORAL | Status: DC

## 2014-09-04 MED ORDER — FENTANYL 50 MCG/HR TD PT72: 50.0000 ug | MEDICATED_PATCH | TRANSDERMAL | Status: DC

## 2014-09-04 MED ORDER — FENTANYL 100 MCG/HR TD PT72: 100.0000 ug | MEDICATED_PATCH | TRANSDERMAL | Status: DC

## 2014-09-04 NOTE — Progress Notes (Unsigned)
Pt here for myeloma f/u.  Back and knee pain the most pain and rating a 5. Pain med doing ok for pain relief.  Pt vomits about 1 time a week and nausea more often than vomiting.  Takes nausea med every morning.  Vomiting in clinic today. Cont. On farydak.

## 2014-09-04 NOTE — Telephone Encounter (Signed)
i called pharmacy and gave verbal ok for refill 2 refills.  i spoke to [place] in the pharmacy and I got verbal permission from [place] NP covering for pandit while on vacation

## 2014-09-04 NOTE — Progress Notes (Signed)
[facility]  Telephone:(336) [phone]  Fax:([phone]) [place] DOB: 1940-08-18  MR#: [id number]  DJS#:[id number]  No care team member to display  CHIEF COMPLAINT:  Relapsed Multiple myeloma, relapse on bone marrow biopsy of August 25, 2008 (initially diagnosed in 2006, initially got pulse Decadron, subsequently Velcade plus Doxil times 6 cycles, and was in complete remission).  Bone marrow biopsy August 25, 2008 - monoclonal plasma cells population present, up to 20% plasma cells in some areas.  Cytogenetics unremarkable.  Myeloma FISH panel normal. Received Velcade and Decadron with good response. Then had autologous BMT on December 12, 2008 (at [facility]).  September 2012 - SIEP (0.4 g) and random UIEP showed recurrent M-spike. 11/23/10 - Bone marrow biopsy shows less than 3% plasma cells.  Dec 2013 - SIEP shows M-spike upto 1.4 g/dL. Jan 2014 - 24-hour UIEP shows M-spike of 24 mg/24 hours, bence jones kappa type. Skeletal Xray survey showed multiple bone lesions   Started Revlimid around 3rd week of Jan 2014. Also on Zometa.   02/13/13 - disease progression, SIEP shows M-spike risen to 2.3 g/dL (was 0.7 in Sept 2014), serum IgG up to 3832. January 2014 - seen by Dr.Donald Valarie Merino (bone marrow [facility] at [facility]) and recommended other salvage treatment.   Hospitalized Feb 2015 for acute renal failure, likely from contrast-induced renal failure and pre-renal. 04/09/13 - CT-guided biopsy.  Diagnosis:   Part A: LIVER BIOPSY: EXTRAMEDULLARY MULTIPLE MYELOMA WITH KAPPA LIGHT CHAIN RESTRICTION, SEE COMMENT.  Part B: RIGHT RIB SOFT TISSUE MASS: EXTRAMEDULLARY MULTIPLE MYELOMA WITH KAPPA LIGHT CHAIN RESTRICTION.   Started oral Pomalyst Feb/March 2015. 09/05/13 - Disease progression with increased M-spike on SIEP, increased kappa/lambda ratio. Patient started next-line treatment with Bendamustine (Treanda) on 09/16/13. Responded to [facility] but had severe allergic reaction  on 2 occasions with cycle 4 and it was discontinued. Started anti-CD38 agent Daratumumab (Darzalex) IV on 02/26/14. Then had disease progression. Started oral Farydak (panobinostat) around end of March 2016.   INTERVAL HISTORY:  Patient is here for continued evaluation and treatment consideration regarding multiple myeloma. Patient is being seen in the absence of Dr. Ma Hillock. Patient was started on Farydak around March 2016. Patient states she is tolerating the treatment fairly well. She does report having random episodes of vomiting, she states it appears to be happening every Thursday for the last couple of weeks. She has been more weak and fatigued recently. She also complains of some increasing shortness of breath with exertion. She denies any acute bleeding, blood in stool, melena.   REVIEW OF SYSTEMS:   Review of Systems  Constitutional: Positive for malaise/fatigue. Negative for fever, chills, weight loss and diaphoresis.  HENT: Negative for congestion, ear discharge, ear pain, hearing loss, nosebleeds, sore throat and tinnitus.   Eyes: Negative for blurred vision, double vision, photophobia, pain, discharge and redness.  Respiratory: Positive for shortness of breath. Negative for cough, hemoptysis, sputum production, wheezing and stridor.   Cardiovascular: Negative for chest pain, palpitations, orthopnea, claudication, leg swelling and PND.  Gastrointestinal: Positive for nausea and vomiting. Negative for heartburn, abdominal pain, diarrhea, constipation, blood in stool and melena.  Genitourinary: Negative.   Musculoskeletal: Negative.   Skin: Negative.   Neurological: Positive for weakness. Negative for dizziness, tingling, focal weakness, seizures and headaches.  Endo/Heme/Allergies: Does not bruise/bleed easily.  Psychiatric/Behavioral: Negative for depression. The patient is not nervous/anxious and does not have insomnia.     As per HPI. Otherwise, a complete  review of systems is  negatve.   PAST MEDICAL HISTORY: No past medical history on file.  PAST SURGICAL HISTORY: No past surgical history on file.  FAMILY HISTORY No family history on file.  GYNECOLOGIC HISTORY:  No LMP recorded.     ADVANCED DIRECTIVES:    HEALTH MAINTENANCE: History  Substance Use Topics  . Smoking status: Never Smoker   . Smokeless tobacco: Not on file  . Alcohol Use: No     Colonoscopy:  PAP:  Bone density:  Lipid panel:  Allergies  Allergen Reactions  . Ronn Melena Hcl] Anaphylaxis    Current Outpatient Prescriptions  Medication Sig Dispense Refill  . fentaNYL (DURAGESIC - DOSED MCG/HR) 100 MCG/HR Place 1 patch (100 mcg total) onto the skin every 3 (three) days. Dr Ma Hillock 10 patch 0  . fentaNYL (DURAGESIC - DOSED MCG/HR) 50 MCG/HR Place 1 patch (50 mcg total) onto the skin every 3 (three) days. 10 patch 0  . morphine (ROXANOL) 20 MG/ML concentrated solution 1 - 1.5 mL  (20 - 30 mg) orally once every 1 - 2 hours as needed for pain. 60 mL 0   No current facility-administered medications for this visit.    OBJECTIVE: There were no vitals taken for this visit.   There is no weight on file to calculate BMI.    ECOG FS:2 - Symptomatic, <50% confined to bed  General: Well-developed, well-nourished, no acute distress. Eyes: Pink conjunctiva, anicteric sclera. HEENT: Normocephalic, moist mucous membranes, clear oropharnyx. Lungs: Clear to auscultation bilaterally. Heart: Regular rate and rhythm. No rubs, murmurs, or gallops. Abdomen: Soft, nontender, nondistended. No organomegaly noted, normoactive bowel sounds. Musculoskeletal: No edema, cyanosis, or clubbing. Neuro: Alert, answering all questions appropriately. Cranial nerves grossly intact. Skin: No rashes or petechiae noted. Psych: Normal affect.  LAB RESULTS:  Clinical Support on 09/04/2014  Component Date Value Ref Range Status  . WBC 09/04/2014 3.3* 3.6 - 11.0 K/uL Final  . RBC 09/04/2014 1.97*  3.80 - 5.20 MIL/uL Final  . Hemoglobin 09/04/2014 7.3* 12.0 - 16.0 g/dL Final  . HCT 09/04/2014 21.8* 35.0 - 47.0 % Final  . MCV 09/04/2014 110.4* 80.0 - 100.0 fL Final  . MCH 09/04/2014 37.0* 26.0 - 34.0 pg Final  . MCHC 09/04/2014 33.5  32.0 - 36.0 g/dL Final  . RDW 09/04/2014 16.8* 11.5 - 14.5 % Final  . Platelets 09/04/2014 101* 150 - 440 K/uL Final  . Neutrophils Relative % 09/04/2014 28   Final  . Neutro Abs 09/04/2014 0.9* 1.4 - 6.5 K/uL Final  . Lymphocytes Relative 09/04/2014 66   Final  . Lymphs Abs 09/04/2014 2.2  1.0 - 3.6 K/uL Final  . Monocytes Relative 09/04/2014 6   Final  . Monocytes Absolute 09/04/2014 0.2  0.2 - 0.9 K/uL Final  . Eosinophils Relative 09/04/2014 0   Final  . Eosinophils Absolute 09/04/2014 0.0  0 - 0.7 K/uL Final  . Basophils Relative 09/04/2014 0   Final  . Basophils Absolute 09/04/2014 0.0  0 - 0.1 K/uL Final    STUDIES: No results found.  ASSESSMENT:  Relapsed multiple myeloma. Status post autologous BMT on December 12, 2008 at [facility], extensive treatment history as described above.  Started next-line treatment with Bendamustine (Treanda) on 09/16/13, responded to [facility] but had severe allergic reaction on 2 occasions with cycle 4 and it was discontinued. Started anti-CD38 agent, daratumumab (Darzalex) IV on 02/26/2014. Feb 2016, pathologic fracture right proximal humerus s/p palliative irradiation. Also s/p one dose of  palliative radiation to hips on 04/28/14 for pain control. Treatment changed to oral Farydak (panobinostat) around end of March 2016 -   She will get CBC, MET-B, LFT, magnesium, phosphorus on April 7 and see me back at 3 weeks later with repeat labs. Reviewed labs from today and discussed with patient and husband present. Tolerating Janey Greaser well, continue this at current dose of 20 mg orally every Monday, Wednesday, and Friday two weeks on and one week off cycles, also on Decadron 20 mg p.o. q.weekly. Patient wants to continue on  current treatment, she is aware that this is palliative in nature and overall prognosis continues to be poor  PLAN:   1. Multiple myeloma. Patient will continue [place] as previously prescribed. 2. Anemia. Hemoglobin 7.3 today, patient with symptomatic anemia. Will transfuse with 1 unit of blood. I have discussed the risks of transfusions with the patient (including but not limited to): Fluid overload, febrile transfusion reaction, transfusion reaction, Hepatitis B, Hepatitis C, HIV, as well as other currently unknown infectious complications. The patient will sign a written consent prior to the procedure.   We will have patient return to office in 4 weeks for reevaluation with primary oncologist.  Patient expressed understanding and was in agreement with this plan. She also understands that She can call clinic at any time with any questions, concerns, or complaints.   Dr. Oliva Bustard was available for consultation and review of plan of care for this patient.   Evlyn Kanner, NP   09/04/2014 2:46 PM

## 2014-09-05 ENCOUNTER — Inpatient Hospital Stay: Payer: Medicare Other

## 2014-09-05 VITALS — BP 105/72 | HR 79 | Temp 98.0°F | Resp 20

## 2014-09-05 DIAGNOSIS — R5381 Other malaise: Secondary | ICD-10-CM | POA: Diagnosis not present

## 2014-09-05 DIAGNOSIS — R531 Weakness: Secondary | ICD-10-CM | POA: Diagnosis not present

## 2014-09-05 DIAGNOSIS — R0602 Shortness of breath: Secondary | ICD-10-CM | POA: Diagnosis not present

## 2014-09-05 DIAGNOSIS — R1110 Vomiting, unspecified: Secondary | ICD-10-CM | POA: Diagnosis not present

## 2014-09-05 DIAGNOSIS — C9002 Multiple myeloma in relapse: Secondary | ICD-10-CM | POA: Diagnosis not present

## 2014-09-05 DIAGNOSIS — Z79899 Other long term (current) drug therapy: Secondary | ICD-10-CM | POA: Diagnosis not present

## 2014-09-05 DIAGNOSIS — C9000 Multiple myeloma not having achieved remission: Secondary | ICD-10-CM

## 2014-09-05 DIAGNOSIS — D649 Anemia, unspecified: Secondary | ICD-10-CM | POA: Diagnosis not present

## 2014-09-05 DIAGNOSIS — R5383 Other fatigue: Secondary | ICD-10-CM | POA: Diagnosis not present

## 2014-09-05 MED ORDER — ACETAMINOPHEN 325 MG PO TABS
650.0000 mg | ORAL_TABLET | Freq: Once | ORAL | Status: AC
Start: 2014-09-05 — End: 2014-09-05
  Administered 2014-09-05: 650 mg via ORAL
  Filled 2014-09-05: qty 2

## 2014-09-05 MED ORDER — DIPHENHYDRAMINE HCL 25 MG PO CAPS
25.0000 mg | ORAL_CAPSULE | Freq: Once | ORAL | Status: AC
  Administered 2014-09-05: 25 mg via ORAL
  Filled 2014-09-05: qty 1

## 2014-09-05 MED ORDER — SODIUM CHLORIDE 0.9 % IV SOLN
250.0000 mL | Freq: Once | INTRAVENOUS | Status: AC
  Administered 2014-09-05: 250 mL via INTRAVENOUS
  Filled 2014-09-05: qty 250

## 2014-09-06 LAB — ABO/RH: ABO/RH(D): O POS

## 2014-09-06 LAB — PREPARE RBC (CROSSMATCH)

## 2014-09-08 LAB — TYPE AND SCREEN
ABO/RH(D): O POS
ANTIBODY SCREEN: NEGATIVE
UNIT DIVISION: 0
UNIT DIVISION: 0

## 2014-09-10 MED ORDER — TIZANIDINE HCL 2 MG PO TABS: 2.0000 mg | ORAL_TABLET | Freq: Three times a day (TID) | ORAL | Status: DC | PRN

## 2014-09-19 ENCOUNTER — Encounter: Payer: Self-pay | Admitting: Family Medicine

## 2014-09-19 NOTE — Addendum Note (Signed)
Addended by: Luella Cook on: 09/19/2014 02:13 PM   Modules accepted: Medications

## 2014-10-02 ENCOUNTER — Inpatient Hospital Stay (HOSPITAL_BASED_OUTPATIENT_CLINIC_OR_DEPARTMENT_OTHER): Payer: Medicare Other | Admitting: Internal Medicine

## 2014-10-02 ENCOUNTER — Inpatient Hospital Stay: Payer: Medicare Other

## 2014-10-02 ENCOUNTER — Inpatient Hospital Stay: Payer: Medicare Other | Attending: Internal Medicine

## 2014-10-02 VITALS — BP 109/66 | HR 104 | Temp 97.2°F | Resp 18 | Ht 61.0 in | Wt 76.1 lb

## 2014-10-02 DIAGNOSIS — C9002 Multiple myeloma in relapse: Secondary | ICD-10-CM

## 2014-10-02 DIAGNOSIS — Z803 Family history of malignant neoplasm of breast: Secondary | ICD-10-CM

## 2014-10-02 DIAGNOSIS — E876 Hypokalemia: Secondary | ICD-10-CM | POA: Diagnosis not present

## 2014-10-02 DIAGNOSIS — C9000 Multiple myeloma not having achieved remission: Secondary | ICD-10-CM | POA: Insufficient documentation

## 2014-10-02 DIAGNOSIS — R531 Weakness: Secondary | ICD-10-CM | POA: Diagnosis not present

## 2014-10-02 DIAGNOSIS — Z79899 Other long term (current) drug therapy: Secondary | ICD-10-CM

## 2014-10-02 DIAGNOSIS — I10 Essential (primary) hypertension: Secondary | ICD-10-CM | POA: Diagnosis not present

## 2014-10-02 DIAGNOSIS — K219 Gastro-esophageal reflux disease without esophagitis: Secondary | ICD-10-CM | POA: Insufficient documentation

## 2014-10-02 DIAGNOSIS — D649 Anemia, unspecified: Secondary | ICD-10-CM | POA: Insufficient documentation

## 2014-10-02 DIAGNOSIS — Z859 Personal history of malignant neoplasm, unspecified: Secondary | ICD-10-CM

## 2014-10-02 LAB — CBC WITH DIFFERENTIAL/PLATELET
Basophils Absolute: 0 10*3/uL (ref 0–0.1)
EOS ABS: 0 10*3/uL (ref 0–0.7)
Eosinophils Relative: 0 %
HEMATOCRIT: 25.9 % — AB (ref 35.0–47.0)
HEMOGLOBIN: 8.9 g/dL — AB (ref 12.0–16.0)
LYMPHS ABS: 2.1 10*3/uL (ref 1.0–3.6)
Lymphocytes Relative: 49 %
MCH: 34.8 pg — ABNORMAL HIGH (ref 26.0–34.0)
MCHC: 34.4 g/dL (ref 32.0–36.0)
MCV: 101.1 fL — ABNORMAL HIGH (ref 80.0–100.0)
Monocytes Absolute: 0.5 10*3/uL (ref 0.2–0.9)
Monocytes Relative: 12 %
NEUTROS ABS: 1.6 10*3/uL (ref 1.4–6.5)
Platelets: 97 10*3/uL — ABNORMAL LOW (ref 150–440)
RBC: 2.56 MIL/uL — AB (ref 3.80–5.20)
RDW: 19.6 % — ABNORMAL HIGH (ref 11.5–14.5)
WBC: 4.1 10*3/uL (ref 3.6–11.0)

## 2014-10-02 LAB — COMPREHENSIVE METABOLIC PANEL
ALT: 38 U/L (ref 14–54)
AST: 65 U/L — ABNORMAL HIGH (ref 15–41)
Albumin: 2.6 g/dL — ABNORMAL LOW (ref 3.5–5.0)
Alkaline Phosphatase: 121 U/L (ref 38–126)
Anion gap: 7 (ref 5–15)
BUN: 13 mg/dL (ref 6–20)
CO2: 30 mmol/L (ref 22–32)
Calcium: 8.8 mg/dL — ABNORMAL LOW (ref 8.9–10.3)
Chloride: 96 mmol/L — ABNORMAL LOW (ref 101–111)
Creatinine, Ser: 0.89 mg/dL (ref 0.44–1.00)
GFR calc Af Amer: >60 mL/min (ref >60)
GFR calc non Af Amer: >60 mL/min (ref >60)
Glucose, Bld: 106 mg/dL — ABNORMAL HIGH (ref 65–99)
Potassium: 4 mmol/L (ref 3.5–5.1)
Sodium: 133 mmol/L — ABNORMAL LOW (ref 135–145)
Total Bilirubin: 0.5 mg/dL (ref 0.3–1.2)
Total Protein: 8.6 g/dL — ABNORMAL HIGH (ref 6.5–8.1)

## 2014-10-02 LAB — MAGNESIUM: Magnesium: 2 mg/dL (ref 1.7–2.4)

## 2014-10-02 MED ORDER — SODIUM CHLORIDE 0.9 % IV SOLN
Freq: Once | INTRAVENOUS | Status: AC
  Administered 2014-10-02: 11:00:00 via INTRAVENOUS
  Filled 2014-10-02: qty 1000

## 2014-10-02 MED ORDER — FENTANYL 100 MCG/HR TD PT72: 100.0000 ug | MEDICATED_PATCH | TRANSDERMAL | Status: DC

## 2014-10-02 MED ORDER — MORPHINE SULFATE (CONCENTRATE) 20 MG/ML PO SOLN: ORAL | Status: DC

## 2014-10-02 MED ORDER — ZOLEDRONIC ACID 4 MG/100ML IV SOLN
3.0000 mg | Freq: Once | INTRAVENOUS | Status: AC
  Administered 2014-10-02: 3 mg via INTRAVENOUS
  Filled 2014-10-02: qty 75

## 2014-10-02 MED ORDER — FENTANYL 50 MCG/HR TD PT72: 50.0000 ug | MEDICATED_PATCH | TRANSDERMAL | Status: DC

## 2014-10-02 NOTE — Progress Notes (Signed)
Patient states that her back is hurting her pretty bad. She rates her back pain a 6-7/10 today, but states that sometimes it feels like a 10.  She also states the arm she fractured (right arm) is starting to bother her more here lately. She has lost a few more pounds, but states that her appetite has been pretty good lately.

## 2014-10-13 ENCOUNTER — Other Ambulatory Visit: Payer: Self-pay | Admitting: Internal Medicine

## 2014-10-13 DIAGNOSIS — C9000 Multiple myeloma not having achieved remission: Secondary | ICD-10-CM

## 2014-10-13 MED ORDER — PANOBINOSTAT LACTATE 20 MG PO CAPS: ORAL_CAPSULE | ORAL | Status: AC

## 2014-10-18 DIAGNOSIS — E119 Type 2 diabetes mellitus without complications: Secondary | ICD-10-CM | POA: Diagnosis not present

## 2014-10-18 NOTE — Progress Notes (Signed)
[place]  [phone] Fax:([phone]) [phone]     ID: Joyce Schultz OB: 01-08-1941  MR#: [id number]  BTY#:[id number]  Patient Care Team: Juline Patch, MD as PCP - General (Family Medicine)  CHIEF COMPLAINT/DIAGNOSIS:  Relapsed Multiple myeloma, relapse on bone marrow biopsy of August 25, 2008 (initially diagnosed in 2006, initially got pulse Decadron, subsequently Velcade plus Doxil times 6 cycles, and was in complete remission).  Bone marrow biopsy August 25, 2008 - monoclonal plasma cells population present, up to 20% plasma cells in some areas.  Cytogenetics unremarkable.  Myeloma FISH panel normal. Received Velcade and Decadron with good response. 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  September 2012 - SIEP (0.4 g) and random UIEP showed recurrent M-spike. 11/23/10 - Bone marrow biopsy shows less than 3% plasma cells.  Dec 2013 - SIEP shows M-spike upto 1.4 g/dL. Jan 2014 - 24-hour UIEP shows M-spike of 24 mg/24 hours, bence jones kappa type. Skeletal Xray survey showed multiple bone lesions   Started Revlimid around 3rd week of Jan 2014. Also on Zometa.   02/13/13 - disease progression, SIEP shows M-spike risen to 2.3 g/dL (was 0.7 in Sept 2014), serum IgG up to 3832. January 2014 - seen by Dr.Donald Valarie Merino (bone marrow transplant at [facility]) and recommended other salvage treatment.   Hospitalised Feb 2015 for acute renal failure, likely from contrast-induced renal failure and pre-renal. 04/09/13 - CT-guided biopsy.  Diagnosis:   Part A: LIVER BIOPSY: EXTRAMEDULLARY MULTIPLE MYELOMA WITH KAPPA LIGHT CHAIN RESTRICTION, SEE COMMENT.  Part B: RIGHT RIB SOFT TISSUE MASS: EXTRAMEDULLARY MULTIPLE MYELOMA WITH KAPPA LIGHT CHAIN RESTRICTION.   Started oral Pomalyst Feb/March 2015. 09/05/13 - Disease progression with increased M-spike on SIEP, increased kappa/lambda ratio. Patient started next-line treatment with Bendamustine (Treanda) on  09/16/13. Responded to [facility] but had severe allergic reaction on 2 occasions with cycle 4 and it was discontinued. Started anti-CD38 agent Daratumumab (Darzalex) IV on 02/26/14. Then had disease progression. Started oral Farydak (panobinostat) around end of March 2016.     HISTORY OF PRESENT ILLNESS:  Patient returns for continued oncology evaluation for myeloma treatment, she started oral Farydak around end of March. States that she is still weak but is dong about the same. Otherwise tolerating this extremely well and denies any side effects from it. Appetite is borderline, states she is trying to eat better, overall feels that she is feeling much better since starting Farydak. She wants to continue on this treatment. No fevers or chills. No major constipation, diarrhea, or blood in stools. No other bleeding issues. Denies any dyspnea at rest, chest pain, palpitation, orthopnea, or PND.    REVIEW OF SYSTEMS:   ROS As in HPI above. In addition, no fever, chills. No new headaches or focal weakness.  No new mood disturbances. No sore throat, cough, shortness of breath, sputum, hemoptysis or chest pain. No dizziness or palpitation. No abdominal pain, constipation, diarrhea, dysuria or hematuria. No new skin rash or bleeding symptoms. No new paresthesias in extremities.  PS ECOG 3.  PAST MEDICAL HISTORY: Reviewed. Past Medical History  Diagnosis Date  . Multiple myeloma     relapse of myeloma was August 25, 2008  . Hypertension   . GERD (gastroesophageal reflux disease)     PAST SURGICAL HISTORY: Reviewed. Past Surgical History  Procedure Laterality Date  . Abdominal hysterectomy    . Appendectomy      FAMILY HISTORY: Reviewed. Family history of breast cancer  and history of diabetes.  SOCIAL HISTORY: Reviewed. Social History  Substance Use Topics  . Smoking status: Never Smoker   . Smokeless tobacco: Never Used  . Alcohol Use: No    Allergies  Allergen Reactions  . Ronn Melena Hcl] Anaphylaxis    Current Outpatient Prescriptions  Medication Sig Dispense Refill  . dexamethasone (DECADRON) 4 MG tablet Take 4 mg by mouth once a week.    . fentaNYL (DURAGESIC - DOSED MCG/HR) 100 MCG/HR Place 1 patch (100 mcg total) onto the skin every 3 (three) days. Dr Ma Hillock 10 patch 0  . fentaNYL (DURAGESIC - DOSED MCG/HR) 50 MCG/HR Place 1 patch (50 mcg total) onto the skin every 3 (three) days. 10 patch 0  . morphine (ROXANOL) 20 MG/ML concentrated solution 1 - 1.5 mL  (20 - 30 mg) orally once every 1 - 2 hours as needed for pain. 60 mL 0  . ondansetron (ZOFRAN) 4 MG tablet Take 4 mg by mouth every 4 (four) hours as needed for nausea or vomiting.    . polyethylene glycol (MIRALAX / GLYCOLAX) packet Take 17 g by mouth daily.    Marland Kitchen senna (SENOKOT) 8.6 MG tablet Take 2 tablets by mouth daily.    Marland Kitchen tiZANidine (ZANAFLEX) 2 MG tablet Take 1 tablet (2 mg total) by mouth every 8 (eight) hours as needed for muscle spasms. 90 tablet 0  . panobinostat lactate (FARYDAK) 20 MG capsule Swallow whole. Take 1 capsule every Monday, Wednesday, Friday on week 1 and week 2, rest during week 3  (2 weeks on and 1 week off). 12 capsule 4   No current facility-administered medications for this visit.    PHYSICAL EXAM: Filed Vitals:   10/02/14 1000  BP: 109/66  Pulse: 104  Temp: 97.2 F (36.2 C)  Resp: 18     Body mass index is 14.38 kg/(m^2).    ECOG FS:3 - Symptomatic, >50% confined to bed  GENERAL: Patient is alert and oriented and in no acute distress. There is no icterus. HEENT: EOMs intact. Oral exam negative for thrush or lesions. No cervical lymphadenopathy. CVS: S1S2, regular LUNGS: Bilaterally clear to auscultation, no rhonchi. ABDOMEN: Soft, nontender. No hepatosplenomegaly clinically.  NEURO: grossly nonfocal, cranial nerves are intact.   EXTREMITIES: No pedal edema.   LAB RESULTS:    Component Value Date/Time   NA 133* 10/02/2014 0946   NA 133* 06/12/2014 1034     K 4.0 10/02/2014 0946   K 3.9 06/12/2014 1034   CL 96* 10/02/2014 0946   CL 92* 06/12/2014 1034   CO2 30 10/02/2014 0946   CO2 34* 06/12/2014 1034   GLUCOSE 106* 10/02/2014 0946   GLUCOSE 129* 06/12/2014 1034   BUN 13 10/02/2014 0946   BUN 13 06/12/2014 1034   CREATININE 0.89 10/02/2014 0946   CREATININE 0.85 06/12/2014 1034   CALCIUM 8.8* 10/02/2014 0946   CALCIUM 8.6* 06/12/2014 1034   PROT 8.6* 10/02/2014 0946   PROT 8.8* 06/12/2014 1034   ALBUMIN 2.6* 10/02/2014 0946   ALBUMIN 2.5* 06/12/2014 1034   AST 65* 10/02/2014 0946   AST 38 06/12/2014 1034   ALT 38 10/02/2014 0946   ALT 31 06/12/2014 1034   ALKPHOS 121 10/02/2014 0946   ALKPHOS 181* 06/12/2014 1034   BILITOT 0.5 10/02/2014 0946   BILITOT 0.5 06/12/2014 1034   GFRNONAA >60 10/02/2014 0946   GFRNONAA >60 06/12/2014 1034   GFRNONAA 52* 03/21/2014 0535   GFRAA >60 10/02/2014 0946   GFRAA >60  06/12/2014 1034   GFRAA >60 03/21/2014 0535    Lab Results  Component Value Date   WBC 4.1 10/02/2014   NEUTROABS 1.6 10/02/2014   HGB 8.9* 10/02/2014   HCT 25.9* 10/02/2014   MCV 101.1* 10/02/2014   PLT 97* 10/02/2014    STUDIES: 08/14/14 - SIEP with M-spike of 2.9 (was 2.9 in Nov 2015, was 5.4 g/dL on 09/05/13). Serum IgG is 4485 (was 6993). Free Kappa Lt Chains 54.72 mg/L, Free Lambda Lt Chains 6.54 mg/L, Kappa/Lambda Ratio 8.37  04/16/14 - SIEP with M-spike of 4.8, serum IgG 6534. Serum Kappa 97.96, Lambda 5.65, k/l ratio 17.34.  12/26/13 - SIEP with M-spike of 2.9 (was 5.4 g/dL on 09/05/13). Serum IgG is 4485 (was 6993). Free Kappa Lt Chains 146.44 mg/L, Free Lambda Lt Chains 8.73 mg/L, Kappa/Lambda Ratio 16.77.   02/13/13 - SIEP with M-spike of 2.3 g/dL (was 0.7 in Sept 2014). Serum IgG 3832. Serum IgM and IgA in normal range. Free K+L Lt Chains,Qn,S Free Kappa Lt Chains,S  [H 98.00 mg/L]  3.30-19.40 Free Lambda Lt Chains,S [20.16 mg/L]    5.71-26.30 Kappa/Lambda Ratio,S    [H  4.86]        0.26-1.39.   ASSESSMENT / PLAN:   1. Relapsed Multiple Myeloma, status post autologous BMT on December 12, 2008 at [facility], extensive treatment history as described above.  Started next-line treatment with Bendamustine (Treanda) on 09/16/13, responded to [facility] but had severe allergic reaction on 2 occasions with cycle 4 and it was discontinued. Started anti-CD38 agent, daratumumab (Darzalex) IV on 02/26/2014. Feb 2016, pathologic fracture right proximal humerus s/p palliative irradiation. Also s/p one dose of palliative radiation to hips on 04/28/14 for pain control. Treatment changed to oral Farydak (panobinostat) around end of March 2016 -  Reviewed labs and d/w patient. Repeat SIEP and kappa/lambda on 6/30 indicates response to farydak therapy. Tolerating Janey Greaser well, continue this at current dose of 20 mg orally every Monday, Wednesday, and Friday two weeks on and one week off cycles, also on Decadron 20 mg p.o. q.weekly. Patient wants to continue on current treatment, she is aware that this is palliative in nature and overall prognosis continues to be poor. We will continue to monitor labs once every 3 weeks. We will get repeat SIEP and kappa/lambda ratio at 9 weeks from now. Next MD followup at 12 weeks and make continued treatment planning.  2. Hypokalemia - improved, continue to monitor and supplement accordingly.  3. Bisphosphonate therapy - continue Zometa q 12 weeks as tolerated by Creatinine clearance. 4. Pain - fairly well controlled, continue fentanyl at same dose, Roxanol p.r.n. for breakthrough pain. 5. Anemia - Patient does not do any activity, and would rather avoid Procrit therapy since she otherwise will have to come weekly. 6. In between visits, patient advised to call or come to ER in case of any pain issues, acute sickness or new symptoms. She is agreeable to this plan.       Leia Alf, MD   10/18/2014 2:19 PM

## 2014-10-23 ENCOUNTER — Other Ambulatory Visit: Payer: Self-pay | Admitting: Internal Medicine

## 2014-10-23 ENCOUNTER — Inpatient Hospital Stay: Payer: Medicare Other | Attending: Internal Medicine

## 2014-10-23 DIAGNOSIS — Z859 Personal history of malignant neoplasm, unspecified: Secondary | ICD-10-CM

## 2014-10-23 DIAGNOSIS — C9002 Multiple myeloma in relapse: Secondary | ICD-10-CM | POA: Insufficient documentation

## 2014-10-23 DIAGNOSIS — C9000 Multiple myeloma not having achieved remission: Secondary | ICD-10-CM

## 2014-10-23 LAB — CBC WITH DIFFERENTIAL/PLATELET
BASOS ABS: 0 10*3/uL (ref 0–0.1)
Eosinophils Absolute: 0 10*3/uL (ref 0–0.7)
Eosinophils Relative: 0 %
HEMATOCRIT: 22.1 % — AB (ref 35.0–47.0)
HEMOGLOBIN: 7.5 g/dL — AB (ref 12.0–16.0)
Lymphs Abs: 1.9 10*3/uL (ref 1.0–3.6)
MCH: 35.5 pg — ABNORMAL HIGH (ref 26.0–34.0)
MCHC: 33.9 g/dL (ref 32.0–36.0)
MCV: 104.7 fL — AB (ref 80.0–100.0)
MONO ABS: 0.1 10*3/uL — AB (ref 0.2–0.9)
NEUTROS ABS: 1.2 10*3/uL — AB (ref 1.4–6.5)
Platelets: 60 10*3/uL — ABNORMAL LOW (ref 150–440)
RBC: 2.12 MIL/uL — ABNORMAL LOW (ref 3.80–5.20)
RDW: 19.5 % — AB (ref 11.5–14.5)
WBC: 3.3 10*3/uL — ABNORMAL LOW (ref 3.6–11.0)

## 2014-10-23 LAB — BASIC METABOLIC PANEL
ANION GAP: 8 (ref 5–15)
BUN: 14 mg/dL (ref 6–20)
CHLORIDE: 97 mmol/L — AB (ref 101–111)
CO2: 26 mmol/L (ref 22–32)
Calcium: 8.2 mg/dL — ABNORMAL LOW (ref 8.9–10.3)
Creatinine, Ser: 0.86 mg/dL (ref 0.44–1.00)
GFR calc non Af Amer: >60 mL/min (ref >60)
GLUCOSE: 109 mg/dL — AB (ref 65–99)
Potassium: 4.1 mmol/L (ref 3.5–5.1)
Sodium: 131 mmol/L — ABNORMAL LOW (ref 135–145)

## 2014-10-23 LAB — MAGNESIUM: Magnesium: 2.2 mg/dL (ref 1.7–2.4)

## 2014-10-23 LAB — PHOSPHORUS: PHOSPHORUS: 3.9 mg/dL (ref 2.5–4.6)

## 2014-10-23 MED ORDER — MORPHINE SULFATE (CONCENTRATE) 20 MG/ML PO SOLN: ORAL | Status: DC

## 2014-10-30 ENCOUNTER — Other Ambulatory Visit: Payer: Self-pay | Admitting: Internal Medicine

## 2014-10-30 ENCOUNTER — Inpatient Hospital Stay: Payer: Medicare Other

## 2014-10-30 DIAGNOSIS — C9002 Multiple myeloma in relapse: Secondary | ICD-10-CM | POA: Diagnosis not present

## 2014-10-30 DIAGNOSIS — C9000 Multiple myeloma not having achieved remission: Secondary | ICD-10-CM

## 2014-10-30 DIAGNOSIS — Z859 Personal history of malignant neoplasm, unspecified: Secondary | ICD-10-CM

## 2014-10-30 LAB — CBC WITH DIFFERENTIAL/PLATELET
BASOS ABS: 0 10*3/uL (ref 0–0.1)
Eosinophils Absolute: 0 10*3/uL (ref 0–0.7)
HCT: 23.6 % — ABNORMAL LOW (ref 35.0–47.0)
Hemoglobin: 8 g/dL — ABNORMAL LOW (ref 12.0–16.0)
Lymphocytes Relative: 58 %
Lymphs Abs: 1.7 10*3/uL (ref 1.0–3.6)
MCH: 35.8 pg — ABNORMAL HIGH (ref 26.0–34.0)
MCHC: 34 g/dL (ref 32.0–36.0)
MCV: 105.3 fL — ABNORMAL HIGH (ref 80.0–100.0)
MONO ABS: 0.4 10*3/uL (ref 0.2–0.9)
Neutro Abs: 0.8 10*3/uL — ABNORMAL LOW (ref 1.4–6.5)
Neutrophils Relative %: 28 %
PLATELETS: 42 10*3/uL — AB (ref 150–440)
RBC: 2.24 MIL/uL — ABNORMAL LOW (ref 3.80–5.20)
RDW: 19.7 % — AB (ref 11.5–14.5)
WBC: 2.9 10*3/uL — ABNORMAL LOW (ref 3.6–11.0)

## 2014-10-30 LAB — SAMPLE TO BLOOD BANK

## 2014-10-30 MED ORDER — FENTANYL 50 MCG/HR TD PT72: 50.0000 ug | MEDICATED_PATCH | TRANSDERMAL | Status: DC

## 2014-10-30 MED ORDER — FENTANYL 100 MCG/HR TD PT72: 100.0000 ug | MEDICATED_PATCH | TRANSDERMAL | Status: DC

## 2014-11-06 ENCOUNTER — Telehealth: Payer: Self-pay | Admitting: Internal Medicine

## 2014-11-06 ENCOUNTER — Other Ambulatory Visit: Payer: Self-pay | Admitting: Internal Medicine

## 2014-11-06 DIAGNOSIS — Z859 Personal history of malignant neoplasm, unspecified: Secondary | ICD-10-CM

## 2014-11-06 DIAGNOSIS — C9000 Multiple myeloma not having achieved remission: Secondary | ICD-10-CM

## 2014-11-06 MED ORDER — MORPHINE SULFATE (CONCENTRATE) 20 MG/ML PO SOLN: ORAL | Status: DC

## 2014-11-07 ENCOUNTER — Ambulatory Visit
Admission: EM | Admit: 2014-11-07 | Discharge: 2014-11-07 | Disposition: A | Discharge status: Home / Self Care | Payer: Medicare Other | Attending: Family Medicine | Admitting: Family Medicine

## 2014-11-07 ENCOUNTER — Encounter: Payer: Self-pay | Admitting: Emergency Medicine

## 2014-11-07 DIAGNOSIS — R300 Dysuria: Secondary | ICD-10-CM

## 2014-11-07 DIAGNOSIS — L739 Follicular disorder, unspecified: Secondary | ICD-10-CM

## 2014-11-07 DIAGNOSIS — L03312 Cellulitis of back [any part except buttock]: Secondary | ICD-10-CM

## 2014-11-07 LAB — URINALYSIS COMPLETE WITH MICROSCOPIC (ARMC ONLY)
Bilirubin Urine: NEGATIVE
Glucose, UA: NEGATIVE mg/dL
Ketones, ur: NEGATIVE mg/dL
Leukocytes, UA: NEGATIVE
Nitrite: NEGATIVE
Protein, ur: 30 mg/dL — AB
Specific Gravity, Urine: 1.015 (ref 1.005–1.030)
pH: 7 (ref 5.0–8.0)

## 2014-11-07 MED ORDER — SULFAMETHOXAZOLE-TRIMETHOPRIM 800-160 MG PO TABS: 1.0000 | ORAL_TABLET | Freq: Two times a day (BID) | ORAL | Status: AC

## 2014-11-07 MED ORDER — ACETAMINOPHEN 500 MG PO TABS: 500.0000 mg | ORAL_TABLET | Freq: Four times a day (QID) | ORAL | Status: AC | PRN

## 2014-11-07 NOTE — ED Provider Notes (Signed)
CSN: [id number]     Arrival date & time 11/07/14  1204 History   First MD Initiated Contact with Patient 11/07/14 1240     Chief Complaint  Patient presents with  . Muscle Pain  . Abscess   (Consider location/radiation/quality/duration/timing/severity/associated sxs/prior Treatment) HPI Comments: Married african [place] female with history of multiple myeloma relapse 2010, PMHx bone marrow transplant.  Rib metastasis noted xray recent oncology appt.  Patient complained of keloid hurting like the last time they had to drain it years ago for an abscess.  Also noted blood on toilet paper when wiping last night and this am.  Right labia sore.  Denied fever, chills.  History of recurrent nausea.  Denied flank pain, decreased urine amounts, gross hematuria in toilet  Patient is a 74 y.o. female presenting with musculoskeletal pain and abscess. The history is provided by the patient and the spouse.  Muscle Pain This is a new problem. The current episode started 2 days ago. The problem occurs constantly. The problem has been gradually worsening. Associated symptoms include headaches. Pertinent negatives include no chest pain, no abdominal pain and no shortness of breath. The symptoms are aggravated by exertion. Nothing relieves the symptoms. She has tried food, water and rest for the symptoms. The treatment provided no relief.  Abscess Location:  Torso Torso abscess location:  Upper back Abscess quality: fluctuance, induration, painful, redness and warmth   Abscess quality: not draining, no itching and not weeping   Red streaking: no   Duration:  2 days Progression:  Worsening Pain details:    Quality:  Aching   Severity:  Mild   Timing:  Constant   Progression:  Worsening Chronicity:  Recurrent Context: immunosuppression and injected drug use   Context: not diabetes, not insect bite/sting and not skin injury   Relieved by:  Nothing Associated symptoms: fatigue, headaches and nausea     Associated symptoms: no anorexia, no fever and no vomiting   Fatigue:    Severity:  Moderate   Timing:  Constant   Progression:  Unchanged Nausea:    Severity:  Mild   Onset quality:  Sudden   Timing:  Intermittent   Progression:  Waxing and waning Risk factors: prior abscess   Risk factors: no family hx of MRSA and no hx of MRSA     Past Medical History  Diagnosis Date  . Multiple myeloma     relapse of myeloma was August 25, 2008  . Hypertension   . GERD (gastroesophageal reflux disease)    Past Surgical History  Procedure Laterality Date  . Abdominal hysterectomy    . Appendectomy     History reviewed. No pertinent family history. Social History  Substance Use Topics  . Smoking status: Never Smoker   . Smokeless tobacco: Never Used  . Alcohol Use: No   OB History    No data available     Review of Systems  Constitutional: Positive for fatigue. Negative for fever, chills, diaphoresis, activity change and appetite change.  HENT: Negative for congestion, dental problem, drooling, ear discharge, ear pain and facial swelling.   Eyes: Negative for photophobia, pain, discharge, redness, itching and visual disturbance.  Respiratory: Negative for cough, shortness of breath, wheezing and stridor.   Cardiovascular: Negative for chest pain, palpitations and leg swelling.  Gastrointestinal: Positive for nausea. Negative for vomiting, abdominal pain, diarrhea, constipation, blood in stool, abdominal distention and anorexia.  Endocrine: Negative for cold intolerance and heat intolerance.  Genitourinary: Positive for hematuria. Negative  for dysuria, flank pain, decreased urine volume, vaginal discharge, enuresis, difficulty urinating and vaginal pain.  Musculoskeletal: Positive for myalgias. Negative for neck pain and neck stiffness.  Skin: Positive for color change and rash. Negative for pallor and wound.  Allergic/Immunologic: Negative for environmental allergies and food  allergies.  Neurological: Positive for weakness and headaches. Negative for dizziness, tremors, seizures, syncope, facial asymmetry, speech difficulty, light-headedness and numbness.  Hematological: Negative for adenopathy. Does not bruise/bleed easily.  Psychiatric/Behavioral: Negative for behavioral problems, confusion, sleep disturbance and agitation.    Allergies  Treanda  Home Medications   Prior to Admission medications   Medication Sig Start Date End Date Taking? Authorizing Provider  acetaminophen (TYLENOL) 500 MG tablet Take 1 tablet (500 mg total) by mouth every 6 (six) hours as needed (1 to 2 tabs max every 6 hours). 11/07/14   Olen Cordial, NP  dexamethasone (DECADRON) 4 MG tablet Take 4 mg by mouth once a week.    Historical Provider, MD  fentaNYL (DURAGESIC - DOSED MCG/HR) 100 MCG/HR Place 1 patch (100 mcg total) onto the skin every 3 (three) days. Dr Ma Hillock 10/30/14   Leia Alf, MD  fentaNYL (DURAGESIC - DOSED MCG/HR) 50 MCG/HR Place 1 patch (50 mcg total) onto the skin every 3 (three) days. 10/30/14   Leia Alf, MD  morphine (ROXANOL) 20 MG/ML concentrated solution 1 - 1.5 mL  (20 - 30 mg) orally once every 1 - 2 hours as needed for pain. 11/06/14   Leia Alf, MD  ondansetron (ZOFRAN) 4 MG tablet Take 4 mg by mouth every 4 (four) hours as needed for nausea or vomiting.    Historical Provider, MD  panobinostat lactate (FARYDAK) 20 MG capsule Swallow whole. Take 1 capsule every Monday, Wednesday, Friday on week 1 and week 2, rest during week 3  (2 weeks on and 1 week off). 10/13/14   Leia Alf, MD  polyethylene glycol (MIRALAX / GLYCOLAX) packet Take 17 g by mouth daily.    Historical Provider, MD  senna (SENOKOT) 8.6 MG tablet Take 2 tablets by mouth daily.    Historical Provider, MD  sulfamethoxazole-trimethoprim (BACTRIM DS,SEPTRA DS) 800-160 MG per tablet Take 1 tablet by mouth 2 (two) times daily. 11/07/14   Olen Cordial, NP  tiZANidine (ZANAFLEX) 2  MG tablet Take 1 tablet (2 mg total) by mouth every 8 (eight) hours as needed for muscle spasms. 09/10/14   Evlyn Kanner, NP   Meds Ordered and Administered this Visit  Medications - No data to display  BP 121/77 mmHg  Pulse 135  Temp(Src) 98.4 F (36.9 C) (Tympanic)  Resp 22  Ht $R'5\' 4"'yT$  (1.626 m)  SpO2 98% No data found.   Physical Exam  Constitutional: She is oriented to person, place, and time. Vital signs are normal. She appears well-developed and well-nourished. No distress.  HENT:  Head: Normocephalic and atraumatic.  Right Ear: External ear normal.  Left Ear: External ear normal.  Nose: Nose normal.  Mouth/Throat: Oropharynx is clear and moist. No oropharyngeal exudate.  Eyes: Conjunctivae, EOM and lids are normal. Pupils are equal, round, and reactive to light. Right eye exhibits no discharge. Left eye exhibits no discharge. No scleral icterus.  Neck: Trachea normal and normal range of motion. Neck supple. No tracheal deviation present.  Cardiovascular: Normal rate, regular rhythm, normal heart sounds and intact distal pulses.  Exam reveals no gallop and no friction rub.   No murmur heard. Pulmonary/Chest: Effort normal and breath sounds normal.  No stridor. No respiratory distress. She has no wheezes. She has no rales.  Abdominal: Soft. She exhibits no shifting dullness, no distension, no pulsatile liver, no fluid wave, no abdominal bruit, no ascites, no pulsatile midline mass and no mass. There is no hepatosplenomegaly. There is no tenderness. There is no rigidity, no guarding, no CVA tenderness, no tenderness at McBurney's point and negative Murphy's sign. Hernia confirmed negative in the ventral area, confirmed negative in the right inguinal area and confirmed negative in the left inguinal area.  Genitourinary:    No labial fusion. There is tenderness on the right labia. There is no rash, lesion or injury on the right labia. There is rash on the left labia.    Musculoskeletal: Normal range of motion. She exhibits no edema or tenderness.  Neurological: She is alert and oriented to person, place, and time. She exhibits normal muscle tone. Coordination normal.  Skin: Skin is warm, dry and intact. Lesion and rash noted. No abrasion, no bruising, no burn, no ecchymosis, no laceration, no petechiae and no purpura noted. Rash is maculopapular and pustular. Rash is not macular, not papular, not nodular, not vesicular and not urticarial. She is not diaphoretic. There is erythema. No cyanosis. No pallor. Nails show no clubbing.     Psychiatric: She has a normal mood and affect. Her speech is normal and behavior is normal. Judgment and thought content normal. Cognition and memory are normal.  Nursing note and vitals reviewed.   ED Course  Procedures (including critical care time)  Labs Review Labs Reviewed  URINALYSIS COMPLETEWITH MICROSCOPIC ([facility]) - Abnormal; Notable for the following:    Hgb urine dipstick 1+ (*)    Protein, ur 30 (*)    Squamous Epithelial / LPF 0-5 (*)    All other components within normal limits  URINE CULTURE    Imaging Review No results found.   MDM   1. Cellulitis of back except buttock   2. Acute folliculitis   3. Dysuria   tylenol 500-$RemoveBefo'1000mg'[id number]$  po QID prn pain.  Ice or warm compress whichever improves pain better.  Will treat for cellulitis keloid upper back. Due to decreased immune status and small size of purulence 3-60mmx5mm collection will hold on I&D.  Bactrim ds po BID x 7 days.   Perineal folliculitis right labia majora. Exitcare handout on skin infection and folliculitis given to patient.  RTC if worsening erythema, pain, purulent discharge, fever.  Wash towels, washcloths, sheets in hot water with bleach every couple of days until infection resolved.  Patient verbalized understanding, agreed with plan of care and had no further questions at this time.     Medications as directed.  Rx bactrim DS po BID x 7  days crosscoverage from cellulitis with abscess back.  Hydrate, avoid dehydration.  Avoid holding urine void on frequent basis every 4 to 6 hours.  If unable to void every 8 hours follow up for re-evaluation with PCM, urgent care or ER.   Call or return to clinic as needed if these symptoms worsen or fail to improve as anticipated.  Urinalysis results discussed with patient and spouse given copy of results discussed urine culture results typically available 48 hours will call with results once available.  Spouse and Patient verbalized agreement and understanding of treatment plan and had no further questions at this time. P2:  Hydrate and cranberry juice   Olen Cordial, NP 11/07/14 1444  10 Nov 2014 0825 Patient notified urine culture normal/negative.  Patient  reported feeling a little better having hip/leg pain today.  Cold front moved in yesterday.  Discussed with patient to try tylenol, hot bath/shower and/or warm pack and if no improvement in symptoms to follow up with PCM.  Patient verbalized understanding of information/instructions, agreed with plan of care and had no further questions at this time.  Olen Cordial, NP 11/10/14 [phone]

## 2014-11-07 NOTE — ED Notes (Signed)
Patient states that when she went to the bathroom last night she noticed blood when she wiped. Patient c/o bodyaches.  Patient also reports a tender bump on her back.

## 2014-11-07 NOTE — Discharge Instructions (Signed)
Abscess An abscess is an infected area that contains a collection of pus and debris.It can occur in almost any part of the body. An abscess is also known as a furuncle or boil. CAUSES  An abscess occurs when tissue gets infected. This can occur from blockage of oil or sweat glands, infection of hair follicles, or a minor injury to the skin. As the body tries to fight the infection, pus collects in the area and creates pressure under the skin. This pressure causes pain. People with weakened immune systems have difficulty fighting infections and get certain abscesses more often.  SYMPTOMS Usually an abscess develops on the skin and becomes a painful mass that is red, warm, and tender. If the abscess forms under the skin, you may feel a moveable soft area under the skin. Some abscesses break open (rupture) on their own, but most will continue to get worse without care. The infection can spread deeper into the body and eventually into the bloodstream, causing you to feel ill.  DIAGNOSIS  Your caregiver will take your medical history and perform a physical exam. A sample of fluid may also be taken from the abscess to determine what is causing your infection. TREATMENT  Your caregiver may prescribe antibiotic medicines to fight the infection. However, taking antibiotics alone usually does not cure an abscess. Your caregiver may need to make a small cut (incision) in the abscess to drain the pus. In some cases, gauze is packed into the abscess to reduce pain and to continue draining the area. HOME CARE INSTRUCTIONS   Only take over-the-counter or prescription medicines for pain, discomfort, or fever as directed by your caregiver.  If you were prescribed antibiotics, take them as directed. Finish them even if you start to feel better.  If gauze is used, follow your caregiver's directions for changing the gauze.  To avoid spreading the infection:  Keep your draining abscess covered with a  bandage.  Wash your hands well.  Do not share personal care items, towels, or whirlpools with others.  Avoid skin contact with others.  Keep your skin and clothes clean around the abscess.  Keep all follow-up appointments as directed by your caregiver. SEEK MEDICAL CARE IF:   You have increased pain, swelling, redness, fluid drainage, or bleeding.  You have muscle aches, chills, or a general ill feeling.  You have a fever. MAKE SURE YOU:   Understand these instructions.  Will watch your condition.  Will get help right away if you are not doing well or get worse. Document Released: 11/10/2004 Document Revised: 08/02/2011 Document Reviewed: 04/15/2011 [facility] Patient Information 2015 [place], [place]. This information is not intended to replace advice given to you by your health care provider. Make sure you discuss any questions you have with your health care provider. Folliculitis  Folliculitis is redness, soreness, and swelling (inflammation) of the hair follicles. This condition can occur anywhere on the body. People with weakened immune systems, diabetes, or obesity have a greater risk of getting folliculitis. CAUSES  Bacterial infection. This is the most common cause.  Fungal infection.  Viral infection.  Contact with certain chemicals, especially oils and tars. Long-term folliculitis can result from bacteria that live in the nostrils. The bacteria may trigger multiple outbreaks of folliculitis over time. SYMPTOMS Folliculitis most commonly occurs on the scalp, thighs, legs, back, buttocks, and areas where hair is shaved frequently. An early sign of folliculitis is a small, white or yellow, pus-filled, itchy lesion (pustule). These lesions appear on  a red, inflamed follicle. They are usually less than 0.2 inches (5 mm) wide. When there is an infection of the follicle that goes deeper, it becomes a boil or furuncle. A group of closely packed boils creates a larger lesion  (carbuncle). Carbuncles tend to occur in hairy, sweaty areas of the body. DIAGNOSIS  Your caregiver can usually tell what is wrong by doing a physical exam. A sample may be taken from one of the lesions and tested in a lab. This can help determine what is causing your folliculitis. TREATMENT  Treatment may include:  Applying warm compresses to the affected areas.  Taking antibiotic medicines orally or applying them to the skin.  Draining the lesions if they contain a large amount of pus or fluid.  Laser hair removal for cases of long-lasting folliculitis. This helps to prevent regrowth of the hair. HOME CARE INSTRUCTIONS  Apply warm compresses to the affected areas as directed by your caregiver.  If antibiotics are prescribed, take them as directed. Finish them even if you start to feel better.  You may take over-the-counter medicines to relieve itching.  Do not shave irritated skin.  Follow up with your caregiver as directed. SEEK IMMEDIATE MEDICAL CARE IF:   You have increasing redness, swelling, or pain in the affected area.  You have a fever. MAKE SURE YOU:  Understand these instructions.  Will watch your condition.  Will get help right away if you are not doing well or get worse. Document Released: 04/11/2001 Document Revised: 08/02/2011 Document Reviewed: 05/03/2011 [facility] Patient Information 2015 [place], [place]. This information is not intended to replace advice given to you by your health care provider. Make sure you discuss any questions you have with your health care provider.

## 2014-11-09 LAB — URINE CULTURE: SPECIAL REQUESTS: NORMAL

## 2014-11-10 ENCOUNTER — Other Ambulatory Visit: Payer: Self-pay | Admitting: *Deleted

## 2014-11-10 MED ORDER — TIZANIDINE HCL 2 MG PO TABS: 2.0000 mg | ORAL_TABLET | Freq: Three times a day (TID) | ORAL | Status: DC | PRN

## 2014-11-10 NOTE — Addendum Note (Signed)
Addended by: Betti Cruz on: 11/10/2014 04:38 PM   Modules accepted: Orders, Medications

## 2014-11-12 ENCOUNTER — Encounter: Payer: Self-pay | Admitting: Family Medicine

## 2014-11-12 ENCOUNTER — Ambulatory Visit (INDEPENDENT_AMBULATORY_CARE_PROVIDER_SITE_OTHER): Payer: Medicare Other | Admitting: Family Medicine

## 2014-11-12 VITALS — BP 100/60 | HR 98 | Temp 98.4°F

## 2014-11-12 DIAGNOSIS — R0789 Other chest pain: Secondary | ICD-10-CM

## 2014-11-12 DIAGNOSIS — R1031 Right lower quadrant pain: Secondary | ICD-10-CM | POA: Diagnosis not present

## 2014-11-12 LAB — HEMOCCULT GUIAC POC 1CARD (OFFICE): FECAL OCCULT BLD: NEGATIVE

## 2014-11-12 MED ORDER — METRONIDAZOLE 500 MG PO TABS: 500.0000 mg | ORAL_TABLET | Freq: Three times a day (TID) | ORAL | Status: DC

## 2014-11-12 NOTE — Progress Notes (Deleted)
Name: GLINDA NATZKE   MRN: [id number]    DOB: 27-Dec-1940   Date:11/12/2014       Progress Note  Subjective  Chief Complaint  Chief Complaint  Patient presents with  . Cough    thick production  . Flank Pain    hurting in R) side "all the way down"    Cough  Flank Pain    No problem-specific assessment & plan notes found for this encounter.   Past Medical History  Diagnosis Date  . Multiple myeloma     relapse of myeloma was August 25, 2008  . Hypertension   . GERD (gastroesophageal reflux disease)     Past Surgical History  Procedure Laterality Date  . Abdominal hysterectomy    . Appendectomy      History reviewed. No pertinent family history.  Social History   Social History  . Marital Status: Married    Spouse Name: N/A  . Number of Children: N/A  . Years of Education: N/A   Occupational History  . Not on file.   Social History Main Topics  . Smoking status: Never Smoker   . Smokeless tobacco: Never Used  . Alcohol Use: No  . Drug Use: No  . Sexual Activity: Not on file   Other Topics Concern  . Not on file   Social History Narrative    Allergies  Allergen Reactions  . Treanda [Bendamustine Hcl] Anaphylaxis     Review of Systems  Respiratory: Positive for cough.   Genitourinary: Positive for flank pain.     Objective  Filed Vitals:   11/12/14 1123  BP: 100/60  Pulse: 98  Temp: 98.4 F (36.9 C)    Physical Exam    Assessment & Plan  Problem List Items Addressed This Visit    None        Dr. Otilio Miu [facility]  11/12/2014

## 2014-11-12 NOTE — Progress Notes (Signed)
Name: Joyce Schultz   MRN: [id number]    DOB: 1940/12/17   Date:11/12/2014       Progress Note  Subjective  Chief Complaint  Chief Complaint  Patient presents with  . Cough    thick production  . Flank Pain    hurting in R) side "all the way down"    Cough This is a new problem. The current episode started in the past 7 days. The problem has been unchanged. The problem occurs hourly. The cough is productive of sputum. Associated symptoms include chest pain. Pertinent negatives include no chills, ear pain, fever, headaches, heartburn, myalgias, rash, sore throat, shortness of breath, weight loss or wheezing. There is no history of environmental allergies.  Flank Pain This is a new problem. The current episode started in the past 7 days. The problem occurs constantly. The problem is unchanged. The quality of the pain is described as aching. Radiates to: vulvar area. The pain is at a severity of 6/10. The pain is moderate. The pain is the same all the time. Associated symptoms include abdominal pain and chest pain. Pertinent negatives include no dysuria, fever, headaches, tingling or weight loss. Risk factors include history of cancer (multiple myeloma). Treatments tried: sulfa for back abcess. The treatment provided no relief.  Abdominal Pain This is a new problem. The current episode started in the past 7 days. The onset quality is sudden. The problem occurs constantly. The problem has been unchanged. The pain is located in the RLQ. The pain is at a severity of 6/10. The pain is moderate. The quality of the pain is aching. The abdominal pain radiates to the perineum. Pertinent negatives include no constipation, diarrhea, dysuria, fever, frequency, headaches, hematuria, melena, myalgias, nausea or weight loss. The pain is relieved by nothing.    No problem-specific assessment & plan notes found for this encounter.   Past Medical History  Diagnosis Date  . Multiple myeloma     relapse of  myeloma was August 25, 2008  . Hypertension   . GERD (gastroesophageal reflux disease)     Past Surgical History  Procedure Laterality Date  . Abdominal hysterectomy    . Appendectomy      History reviewed. No pertinent family history.  Social History   Social History  . Marital Status: Married    Spouse Name: N/A  . Number of Children: N/A  . Years of Education: N/A   Occupational History  . Not on file.   Social History Main Topics  . Smoking status: Never Smoker   . Smokeless tobacco: Never Used  . Alcohol Use: No  . Drug Use: No  . Sexual Activity: Not on file   Other Topics Concern  . Not on file   Social History Narrative    Allergies  Allergen Reactions  . Treanda [Bendamustine Hcl] Anaphylaxis     Review of Systems  Constitutional: Negative for fever, chills, weight loss and malaise/fatigue.  HENT: Negative for ear discharge, ear pain and sore throat.   Eyes: Negative for blurred vision.  Respiratory: Positive for cough. Negative for sputum production, shortness of breath and wheezing.   Cardiovascular: Positive for chest pain. Negative for palpitations and leg swelling.  Gastrointestinal: Positive for abdominal pain. Negative for heartburn, nausea, diarrhea, constipation, blood in stool and melena.  Genitourinary: Positive for flank pain. Negative for dysuria, urgency, frequency and hematuria.  Musculoskeletal: Negative for myalgias, back pain, joint pain and neck pain.  Skin: Negative for rash.  Neurological:  Negative for dizziness, tingling, sensory change, focal weakness and headaches.  Endo/Heme/Allergies: Negative for environmental allergies and polydipsia. Does not bruise/bleed easily.  Psychiatric/Behavioral: Negative for depression and suicidal ideas. The patient is not nervous/anxious and does not have insomnia.      Objective  Filed Vitals:   11/12/14 1123  BP: 100/60  Pulse: 98  Temp: 98.4 F (36.9 C)    Physical Exam    Constitutional: She is well-developed, well-nourished, and in no distress. No distress.  HENT:  Head: Normocephalic and atraumatic.  Right Ear: External ear normal.  Left Ear: External ear normal.  Nose: Nose normal.  Mouth/Throat: Oropharynx is clear and moist.  Eyes: Conjunctivae and EOM are normal. Pupils are equal, round, and reactive to light. Right eye exhibits no discharge. Left eye exhibits no discharge.  Neck: Normal range of motion. Neck supple. No JVD present. No thyromegaly present.  Cardiovascular: Normal rate, regular rhythm, normal heart sounds and intact distal pulses.  Exam reveals no gallop and no friction rub.   No murmur heard. Pulmonary/Chest: Effort normal and breath sounds normal. No respiratory distress. She has no wheezes. She has no rales. She exhibits tenderness.    Tender along lateral aspect of right 7-9th ribs  Abdominal: Soft. Bowel sounds are normal. She exhibits no mass. There is no hepatosplenomegaly. There is tenderness in the right lower quadrant. There is rebound and guarding. There is no CVA tenderness.  Genitourinary: Rectum normal, vagina normal, left adnexa normal and vulva normal. Guaiac negative stool. Right adnexum displays tenderness. Vulva exhibits no lesion, no rash and no tenderness. No vaginal discharge found.  Tenderness right adnexa  Musculoskeletal: Normal range of motion. She exhibits no edema.  Lymphadenopathy:    She has no cervical adenopathy.  Neurological: She is alert. She has normal reflexes.  Skin: Skin is warm and dry. She is not diaphoretic.  Psychiatric: Mood and affect normal.      Assessment & Plan  Problem List Items Addressed This Visit    None    Visit Diagnoses    Right-sided chest wall pain    -  Primary    Relevant Orders    Ambulatory referral to Oncology    Right lower quadrant abdominal pain        Relevant Medications    metroNIDAZOLE (FLAGYL) 500 MG tablet    Other Relevant Orders    POCT Occult  Blood Stool (Completed)         Dr. [facility]  11/12/2014

## 2014-11-13 ENCOUNTER — Other Ambulatory Visit: Payer: Self-pay | Admitting: Internal Medicine

## 2014-11-13 ENCOUNTER — Inpatient Hospital Stay: Payer: Medicare Other

## 2014-11-13 ENCOUNTER — Telehealth: Payer: Self-pay | Admitting: *Deleted

## 2014-11-13 DIAGNOSIS — C9000 Multiple myeloma not having achieved remission: Secondary | ICD-10-CM

## 2014-11-13 DIAGNOSIS — Z859 Personal history of malignant neoplasm, unspecified: Secondary | ICD-10-CM

## 2014-11-13 DIAGNOSIS — C9002 Multiple myeloma in relapse: Secondary | ICD-10-CM | POA: Diagnosis not present

## 2014-11-13 LAB — CBC WITH DIFFERENTIAL/PLATELET
Basophils Absolute: 0 10*3/uL (ref 0–0.1)
Eosinophils Absolute: 0 10*3/uL (ref 0–0.7)
HCT: 22.4 % — ABNORMAL LOW (ref 35.0–47.0)
Hemoglobin: 7.6 g/dL — ABNORMAL LOW (ref 12.0–16.0)
Lymphs Abs: 2 10*3/uL (ref 1.0–3.6)
MCH: 35.8 pg — ABNORMAL HIGH (ref 26.0–34.0)
MCHC: 33.8 g/dL (ref 32.0–36.0)
MCV: 106.1 fL — AB (ref 80.0–100.0)
Monocytes Absolute: 0.1 10*3/uL — ABNORMAL LOW (ref 0.2–0.9)
Monocytes Relative: 2 %
NEUTROS ABS: 1 10*3/uL — AB (ref 1.4–6.5)
Neutrophils Relative %: 32 %
PLATELETS: 79 10*3/uL — AB (ref 150–440)
RBC: 2.12 MIL/uL — AB (ref 3.80–5.20)
RDW: 18.8 % — AB (ref 11.5–14.5)
WBC: 3.1 10*3/uL — AB (ref 3.6–11.0)

## 2014-11-13 LAB — BASIC METABOLIC PANEL
ANION GAP: 3 — AB (ref 5–15)
BUN: 16 mg/dL (ref 6–20)
CALCIUM: 8.6 mg/dL — AB (ref 8.9–10.3)
CO2: 32 mmol/L (ref 22–32)
Chloride: 94 mmol/L — ABNORMAL LOW (ref 101–111)
Creatinine, Ser: 1.45 mg/dL — ABNORMAL HIGH (ref 0.44–1.00)
GFR, EST AFRICAN AMERICAN: 41 mL/min — AB (ref >60)
GFR, EST NON AFRICAN AMERICAN: 35 mL/min — AB (ref >60)
Glucose, Bld: 102 mg/dL — ABNORMAL HIGH (ref 65–99)
POTASSIUM: 3.5 mmol/L (ref 3.5–5.1)
Sodium: 129 mmol/L — ABNORMAL LOW (ref 135–145)

## 2014-11-13 LAB — HEPATIC FUNCTION PANEL
ALBUMIN: 2.8 g/dL — AB (ref 3.5–5.0)
ALK PHOS: 106 U/L (ref 38–126)
ALT: 16 U/L (ref 14–54)
AST: 50 U/L — ABNORMAL HIGH (ref 15–41)
BILIRUBIN INDIRECT: 0.4 mg/dL (ref 0.3–0.9)
Bilirubin, Direct: 0.1 mg/dL (ref 0.1–0.5)
TOTAL PROTEIN: 10.1 g/dL — AB (ref 6.5–8.1)
Total Bilirubin: 0.5 mg/dL (ref 0.3–1.2)

## 2014-11-13 LAB — PHOSPHORUS: PHOSPHORUS: 3.7 mg/dL (ref 2.5–4.6)

## 2014-11-13 LAB — MAGNESIUM: MAGNESIUM: 2.3 mg/dL (ref 1.7–2.4)

## 2014-11-13 MED ORDER — MORPHINE SULFATE (CONCENTRATE) 20 MG/ML PO SOLN: ORAL | Status: DC

## 2014-11-13 NOTE — Telephone Encounter (Signed)
Notes Recorded by Leia Alf, MD on 11/13/2014 at 1:41 PM Serum Cr is higher at 1.45 (was 0.86). Please remind patient to increase oral water intake.  Call notified. RN Encouraged po oral fluid intake. Patient states that she has been "nauseated several times this week x 2-3 days" and she "has not been able to drink as much."  Teach back process performed with patient.

## 2014-11-14 LAB — KAPPA/LAMBDA LIGHT CHAINS
KAPPA, LAMDA LIGHT CHAIN RATIO: 14.61 — AB (ref 0.26–1.65)
Kappa free light chain: 106.51 mg/L — ABNORMAL HIGH (ref 3.30–19.40)
LAMDA FREE LIGHT CHAINS: 7.29 mg/L (ref 5.71–26.30)

## 2014-11-14 LAB — PROTEIN ELECTROPHORESIS, SERUM
A/G Ratio: 0.6 — ABNORMAL LOW (ref 0.7–1.7)
ALBUMIN ELP: 3.4 g/dL (ref 2.9–4.4)
ALPHA-1-GLOBULIN: 0.4 g/dL (ref 0.0–0.4)
ALPHA-2-GLOBULIN: 0.7 g/dL (ref 0.4–1.0)
Beta Globulin: 0.8 g/dL (ref 0.7–1.3)
GLOBULIN, TOTAL: 6.1 g/dL — AB (ref 2.2–3.9)
Gamma Globulin: 4.3 g/dL — ABNORMAL HIGH (ref 0.4–1.8)
M-SPIKE, %: 4.1 g/dL — AB
Total Protein ELP: 9.5 g/dL — ABNORMAL HIGH (ref 6.0–8.5)

## 2014-11-17 ENCOUNTER — Other Ambulatory Visit: Payer: Self-pay | Admitting: Internal Medicine

## 2014-11-17 NOTE — Progress Notes (Signed)
Labs done on 11/13/14 shows M-spike has increased to 4.1 (was 2.9 done 3 months ago), serum kappa light chains has increased to 106.51 (from 54.72), creatinine is up to 1.45. Have discussed with the patient over the phone today, explained in detail that this is  indicative of myeloma progression and discussed further options including pursuing different treatment like Empliciti (could combine this with Decadron, but otherwise Joyce Schultz would not be able to tolerate Revlimid and has also failed Revlimid in the past) but the chance of side effects is high given her poor performance status and heavy pretreatment history, versus pursuing supportive care/hospice. Patient does not want to consider hospice at this time, Joyce Schultz wants to continue on treatment as long as possible. Joyce Schultz also states that at least on 2 occasions there was up to 2 weeks delay in receiving Farydak pills in the mail and feels that this might be the reason for myeloma getting worse, and wants to continue on Farydak since Joyce Schultz feels significant clinical benefit from it. Joyce Schultz will come for repeat creatinine on 10/6 after increasing oral fluid intake. Plan at this time is to continue on Farydak and if Joyce Schultz has disease progression upon next lab monitoring will need to consider treatment change, or earlier if Joyce Schultz develops progressive bone pains, renal insufficiency or other myeloma symptoms. Patient understands overall prognosis is extremely poor and is agreeable to this plan.

## 2014-11-18 ENCOUNTER — Other Ambulatory Visit: Payer: Self-pay | Admitting: *Deleted

## 2014-11-18 DIAGNOSIS — C9000 Multiple myeloma not having achieved remission: Secondary | ICD-10-CM

## 2014-11-20 ENCOUNTER — Inpatient Hospital Stay: Payer: Medicare Other | Attending: Internal Medicine

## 2014-11-20 DIAGNOSIS — C9002 Multiple myeloma in relapse: Secondary | ICD-10-CM | POA: Diagnosis not present

## 2014-11-20 DIAGNOSIS — C9000 Multiple myeloma not having achieved remission: Secondary | ICD-10-CM

## 2014-11-20 LAB — CBC WITH DIFFERENTIAL/PLATELET
Basophils Absolute: 0 K/uL (ref 0–0.1)
Basophils Relative: 0 %
Eosinophils Absolute: 0 K/uL (ref 0–0.7)
Eosinophils Relative: 0 %
HCT: 23.1 % — ABNORMAL LOW (ref 35.0–47.0)
Hemoglobin: 7.8 g/dL — ABNORMAL LOW (ref 12.0–16.0)
Lymphocytes Relative: 69 %
Lymphs Abs: 2.2 K/uL (ref 1.0–3.6)
MCH: 36.2 pg — ABNORMAL HIGH (ref 26.0–34.0)
MCHC: 34 g/dL (ref 32.0–36.0)
MCV: 106.4 fL — ABNORMAL HIGH (ref 80.0–100.0)
Monocytes Absolute: 0.1 K/uL — ABNORMAL LOW (ref 0.2–0.9)
Monocytes Relative: 3 %
Neutro Abs: 0.9 K/uL — ABNORMAL LOW (ref 1.4–6.5)
Neutrophils Relative %: 28 %
Platelets: 25 K/uL — CL (ref 150–440)
RBC: 2.17 MIL/uL — ABNORMAL LOW (ref 3.80–5.20)
RDW: 17.4 % — ABNORMAL HIGH (ref 11.5–14.5)
WBC: 3.2 K/uL — ABNORMAL LOW (ref 3.6–11.0)

## 2014-11-20 LAB — BASIC METABOLIC PANEL WITH GFR
Anion gap: 5 (ref 5–15)
BUN: 16 mg/dL (ref 6–20)
CO2: 30 mmol/L (ref 22–32)
Calcium: 8.7 mg/dL — ABNORMAL LOW (ref 8.9–10.3)
Chloride: 95 mmol/L — ABNORMAL LOW (ref 101–111)
Creatinine, Ser: 1.08 mg/dL — ABNORMAL HIGH (ref 0.44–1.00)
GFR calc Af Amer: 58 mL/min — ABNORMAL LOW
GFR calc non Af Amer: 50 mL/min — ABNORMAL LOW
Glucose, Bld: 161 mg/dL — ABNORMAL HIGH (ref 65–99)
Potassium: 3.9 mmol/L (ref 3.5–5.1)
Sodium: 130 mmol/L — ABNORMAL LOW (ref 135–145)

## 2014-11-20 LAB — SAMPLE TO BLOOD BANK

## 2014-11-26 ENCOUNTER — Telehealth: Payer: Self-pay | Admitting: *Deleted

## 2014-11-26 ENCOUNTER — Inpatient Hospital Stay: Payer: Medicare Other

## 2014-11-26 DIAGNOSIS — C9002 Multiple myeloma in relapse: Secondary | ICD-10-CM | POA: Diagnosis not present

## 2014-11-26 DIAGNOSIS — C9000 Multiple myeloma not having achieved remission: Secondary | ICD-10-CM

## 2014-11-26 DIAGNOSIS — Z859 Personal history of malignant neoplasm, unspecified: Secondary | ICD-10-CM

## 2014-11-26 LAB — CBC WITH DIFFERENTIAL/PLATELET
Basophils Absolute: 0 10*3/uL (ref 0–0.1)
EOS ABS: 0 10*3/uL (ref 0–0.7)
Eosinophils Relative: 0 %
HCT: 22.4 % — ABNORMAL LOW (ref 35.0–47.0)
HEMOGLOBIN: 7.6 g/dL — AB (ref 12.0–16.0)
Lymphocytes Relative: 43 %
Lymphs Abs: 1.7 10*3/uL (ref 1.0–3.6)
MCH: 36.4 pg — ABNORMAL HIGH (ref 26.0–34.0)
MCHC: 33.9 g/dL (ref 32.0–36.0)
MCV: 107.3 fL — ABNORMAL HIGH (ref 80.0–100.0)
Monocytes Absolute: 0.8 10*3/uL (ref 0.2–0.9)
Monocytes Relative: 20 %
Neutro Abs: 1.5 10*3/uL (ref 1.4–6.5)
Platelets: 56 10*3/uL — ABNORMAL LOW (ref 150–440)
RBC: 2.08 MIL/uL — AB (ref 3.80–5.20)
RDW: 17.6 % — ABNORMAL HIGH (ref 11.5–14.5)
WBC: 4 10*3/uL (ref 3.6–11.0)

## 2014-11-26 LAB — BASIC METABOLIC PANEL
ANION GAP: 9 (ref 5–15)
BUN: 8 mg/dL (ref 6–20)
CHLORIDE: 92 mmol/L — AB (ref 101–111)
CO2: 28 mmol/L (ref 22–32)
Calcium: 8.3 mg/dL — ABNORMAL LOW (ref 8.9–10.3)
Creatinine, Ser: 1.01 mg/dL — ABNORMAL HIGH (ref 0.44–1.00)
GFR calc non Af Amer: 54 mL/min — ABNORMAL LOW (ref >60)
Glucose, Bld: 168 mg/dL — ABNORMAL HIGH (ref 65–99)
POTASSIUM: 2.9 mmol/L — AB (ref 3.5–5.1)
SODIUM: 129 mmol/L — AB (ref 135–145)

## 2014-11-26 LAB — PHOSPHORUS: Phosphorus: 3.1 mg/dL (ref 2.5–4.6)

## 2014-11-26 LAB — MAGNESIUM: MAGNESIUM: 1.9 mg/dL (ref 1.7–2.4)

## 2014-11-26 MED ORDER — POTASSIUM CHLORIDE ER 20 MEQ PO TBCR: EXTENDED_RELEASE_TABLET | ORAL | Status: DC

## 2014-11-26 NOTE — Telephone Encounter (Signed)
Called Sherry RN in [place] after receiving a call from the [place] lab,Sharon reporting Potassium level of 2.9. Judeen Hammans will inform Dr Seward Meth of those results.

## 2014-11-26 NOTE — Telephone Encounter (Signed)
Got a call from the lab that pt's potassium was 2.9 and she called me to let me know since pandit is not here.  I waited for dr B to get out of pt's room and relayed the lab value and he says potassium 20 meq bid x 4 days and then once daily until she comes back next week for lab draw. I have entered the rx and also called pt and left her a message about the potassium and she will need to start back on potassium. and entered orders for labs next week too.

## 2014-12-03 ENCOUNTER — Telehealth: Payer: Self-pay | Admitting: Internal Medicine

## 2014-12-03 ENCOUNTER — Other Ambulatory Visit: Payer: Self-pay | Admitting: *Deleted

## 2014-12-03 DIAGNOSIS — Z859 Personal history of malignant neoplasm, unspecified: Secondary | ICD-10-CM

## 2014-12-03 DIAGNOSIS — C9000 Multiple myeloma not having achieved remission: Secondary | ICD-10-CM

## 2014-12-03 MED ORDER — FENTANYL 100 MCG/HR TD PT72: 100.0000 ug | MEDICATED_PATCH | TRANSDERMAL | Status: DC

## 2014-12-03 MED ORDER — FENTANYL 50 MCG/HR TD PT72
50.0000 ug | MEDICATED_PATCH | TRANSDERMAL | Status: DC
Start: 2014-12-03 — End: 2014-12-22

## 2014-12-03 NOTE — Telephone Encounter (Signed)
She needs a fentanyl patch when she comes for labwork on 12/04/14.

## 2014-12-03 NOTE — Telephone Encounter (Signed)
  Is this the lady you spoke of?  M

## 2014-12-04 ENCOUNTER — Telehealth: Payer: Self-pay | Admitting: *Deleted

## 2014-12-04 ENCOUNTER — Other Ambulatory Visit: Payer: Self-pay | Admitting: Internal Medicine

## 2014-12-04 ENCOUNTER — Inpatient Hospital Stay: Payer: Medicare Other

## 2014-12-04 ENCOUNTER — Other Ambulatory Visit: Payer: Self-pay | Admitting: *Deleted

## 2014-12-04 DIAGNOSIS — D6481 Anemia due to antineoplastic chemotherapy: Secondary | ICD-10-CM

## 2014-12-04 DIAGNOSIS — C9000 Multiple myeloma not having achieved remission: Secondary | ICD-10-CM

## 2014-12-04 DIAGNOSIS — C9002 Multiple myeloma in relapse: Secondary | ICD-10-CM | POA: Diagnosis not present

## 2014-12-04 DIAGNOSIS — T451X5A Adverse effect of antineoplastic and immunosuppressive drugs, initial encounter: Principal | ICD-10-CM

## 2014-12-04 LAB — BASIC METABOLIC PANEL
ANION GAP: 8 (ref 5–15)
BUN: 9 mg/dL (ref 6–20)
CALCIUM: 8.1 mg/dL — AB (ref 8.9–10.3)
CO2: 28 mmol/L (ref 22–32)
Chloride: 97 mmol/L — ABNORMAL LOW (ref 101–111)
Creatinine, Ser: 1.03 mg/dL — ABNORMAL HIGH (ref 0.44–1.00)
GFR, EST NON AFRICAN AMERICAN: 53 mL/min — AB (ref >60)
GLUCOSE: 104 mg/dL — AB (ref 65–99)
POTASSIUM: 4 mmol/L (ref 3.5–5.1)
Sodium: 133 mmol/L — ABNORMAL LOW (ref 135–145)

## 2014-12-04 LAB — CBC WITH DIFFERENTIAL/PLATELET
Basophils Absolute: 0 10*3/uL (ref 0–0.1)
Basophils Relative: 0 %
EOS PCT: 0 %
Eosinophils Absolute: 0 10*3/uL (ref 0–0.7)
HCT: 20.4 % — ABNORMAL LOW (ref 35.0–47.0)
Hemoglobin: 6.9 g/dL — ABNORMAL LOW (ref 12.0–16.0)
LYMPHS ABS: 2.2 10*3/uL (ref 1.0–3.6)
LYMPHS PCT: 52 %
MCH: 36.5 pg — AB (ref 26.0–34.0)
MCHC: 33.7 g/dL (ref 32.0–36.0)
MCV: 108.2 fL — AB (ref 80.0–100.0)
MONO ABS: 0.5 10*3/uL (ref 0.2–0.9)
MONOS PCT: 11 %
Neutro Abs: 1.6 10*3/uL (ref 1.4–6.5)
Neutrophils Relative %: 37 %
PLATELETS: 114 10*3/uL — AB (ref 150–440)
RBC: 1.88 MIL/uL — AB (ref 3.80–5.20)
RDW: 17.4 % — ABNORMAL HIGH (ref 11.5–14.5)
WBC: 4.3 10*3/uL (ref 3.6–11.0)

## 2014-12-04 NOTE — Telephone Encounter (Signed)
Called pt to inform her that her hgb 6.9 today. She will need 2 units of blood.  I told her that she will need the type and cross and dr b wanted extra labs also and then she will get 2 units.  Pt unable to get a ride today to come for the type and screen so i have spoke to [facility] staff and they will give her the blood here.  Called pt back to tell her she needs to be here by 8:30 on Friday ( tom.) in order to get blood work and get 2 units of blood that she needs.  Also /dr. B wants to see her next week in [place] and the only day to see her is tues and I will make appt and give her tom. She is agreeable to the plan

## 2014-12-05 ENCOUNTER — Inpatient Hospital Stay: Payer: Medicare Other | Attending: Family Medicine

## 2014-12-05 ENCOUNTER — Inpatient Hospital Stay: Payer: Medicare Other

## 2014-12-05 VITALS — BP 118/74 | HR 94 | Temp 97.0°F | Resp 20

## 2014-12-05 DIAGNOSIS — Z79899 Other long term (current) drug therapy: Secondary | ICD-10-CM | POA: Diagnosis not present

## 2014-12-05 DIAGNOSIS — Z7401 Bed confinement status: Secondary | ICD-10-CM | POA: Insufficient documentation

## 2014-12-05 DIAGNOSIS — K219 Gastro-esophageal reflux disease without esophagitis: Secondary | ICD-10-CM | POA: Diagnosis not present

## 2014-12-05 DIAGNOSIS — M79651 Pain in right thigh: Secondary | ICD-10-CM | POA: Insufficient documentation

## 2014-12-05 DIAGNOSIS — Z888 Allergy status to other drugs, medicaments and biological substances status: Secondary | ICD-10-CM | POA: Insufficient documentation

## 2014-12-05 DIAGNOSIS — R634 Abnormal weight loss: Secondary | ICD-10-CM | POA: Diagnosis not present

## 2014-12-05 DIAGNOSIS — R64 Cachexia: Secondary | ICD-10-CM | POA: Insufficient documentation

## 2014-12-05 DIAGNOSIS — I10 Essential (primary) hypertension: Secondary | ICD-10-CM | POA: Insufficient documentation

## 2014-12-05 DIAGNOSIS — T451X5A Adverse effect of antineoplastic and immunosuppressive drugs, initial encounter: Principal | ICD-10-CM

## 2014-12-05 DIAGNOSIS — Z993 Dependence on wheelchair: Secondary | ICD-10-CM | POA: Insufficient documentation

## 2014-12-05 DIAGNOSIS — D6481 Anemia due to antineoplastic chemotherapy: Secondary | ICD-10-CM

## 2014-12-05 DIAGNOSIS — R110 Nausea: Secondary | ICD-10-CM | POA: Insufficient documentation

## 2014-12-05 DIAGNOSIS — C9000 Multiple myeloma not having achieved remission: Secondary | ICD-10-CM

## 2014-12-05 DIAGNOSIS — C9002 Multiple myeloma in relapse: Secondary | ICD-10-CM | POA: Diagnosis not present

## 2014-12-05 LAB — PREPARE RBC (CROSSMATCH)

## 2014-12-05 MED ORDER — ACETAMINOPHEN 325 MG PO TABS
650.0000 mg | ORAL_TABLET | Freq: Once | ORAL | Status: AC
  Administered 2014-12-05: 650 mg via ORAL
  Filled 2014-12-05: qty 2

## 2014-12-05 MED ORDER — SODIUM CHLORIDE 0.9 % IV SOLN
250.0000 mL | Freq: Once | INTRAVENOUS | Status: AC
  Administered 2014-12-05: 250 mL via INTRAVENOUS
  Filled 2014-12-05: qty 250

## 2014-12-05 MED ORDER — DIPHENHYDRAMINE HCL 25 MG PO CAPS
25.0000 mg | ORAL_CAPSULE | Freq: Once | ORAL | Status: AC
  Administered 2014-12-05: 25 mg via ORAL
  Filled 2014-12-05: qty 1

## 2014-12-06 ENCOUNTER — Other Ambulatory Visit: Payer: Self-pay | Admitting: Internal Medicine

## 2014-12-06 LAB — TYPE AND SCREEN
ABO/RH(D): O POS
ANTIBODY SCREEN: NEGATIVE
UNIT DIVISION: 0
UNIT DIVISION: 0

## 2014-12-07 LAB — BETA 2 MICROGLOBULIN, SERUM: Beta-2 Microglobulin: 3.3 mg/L — ABNORMAL HIGH (ref 0.6–2.4)

## 2014-12-07 LAB — KAPPA/LAMBDA LIGHT CHAINS
KAPPA FREE LGHT CHN: 95.55 mg/L — AB (ref 3.30–19.40)
Kappa, lambda light chain ratio: 16.25 — ABNORMAL HIGH (ref 0.26–1.65)
LAMDA FREE LIGHT CHAINS: 5.88 mg/L (ref 5.71–26.30)

## 2014-12-08 LAB — MULTIPLE MYELOMA PANEL, SERUM
ALBUMIN/GLOB SERPL: 0.6 — AB (ref 0.7–1.7)
ALPHA 1: 0.3 g/dL (ref 0.0–0.4)
ALPHA2 GLOB SERPL ELPH-MCNC: 0.6 g/dL (ref 0.4–1.0)
Albumin SerPl Elph-Mcnc: 3.2 g/dL (ref 2.9–4.4)
B-Globulin SerPl Elph-Mcnc: 0.7 g/dL (ref 0.7–1.3)
GLOBULIN, TOTAL: 6.1 g/dL — AB (ref 2.2–3.9)
Gamma Glob SerPl Elph-Mcnc: 4.4 g/dL — ABNORMAL HIGH (ref 0.4–1.8)
IGG (IMMUNOGLOBIN G), SERUM: 5803 mg/dL — AB (ref 700–1600)
IGM, SERUM: 5 mg/dL — AB (ref 26–217)
IgA: 8 mg/dL — ABNORMAL LOW (ref 64–422)
M Protein SerPl Elph-Mcnc: 4.2 g/dL — ABNORMAL HIGH
Total Protein ELP: 9.3 g/dL — ABNORMAL HIGH (ref 6.0–8.5)

## 2014-12-10 ENCOUNTER — Telehealth: Payer: Self-pay | Admitting: *Deleted

## 2014-12-10 NOTE — Telephone Encounter (Signed)
Called the patient and she is in severe pain and the gel is not helping with pain at all, she is taking liq morphine more often and she is hurting in her knee and bottom.  She is in tears on the phone.  I  Spoke to Dr. B and he is agreeable to add her on for tom. 11 am for labs and 11:30 to see md.  She will have to come to [place] and she is aware and will try to make it

## 2014-12-10 NOTE — Telephone Encounter (Signed)
C/o pain  In knee and bottom, aching and sore. Patient has appt on 11/1, I explained that at this time of the day if she needs something done immediately, she will need to got to ER or Urgent care to be seen. Also, Dr Rogue Bussing does not have any openings until Monday. She tearfully said "OK"

## 2014-12-11 ENCOUNTER — Inpatient Hospital Stay: Payer: Medicare Other

## 2014-12-11 ENCOUNTER — Inpatient Hospital Stay (HOSPITAL_BASED_OUTPATIENT_CLINIC_OR_DEPARTMENT_OTHER): Payer: Medicare Other | Admitting: Internal Medicine

## 2014-12-11 VITALS — BP 110/74 | HR 111 | Temp 98.2°F

## 2014-12-11 DIAGNOSIS — C9000 Multiple myeloma not having achieved remission: Secondary | ICD-10-CM

## 2014-12-11 DIAGNOSIS — M79651 Pain in right thigh: Secondary | ICD-10-CM

## 2014-12-11 DIAGNOSIS — Z888 Allergy status to other drugs, medicaments and biological substances status: Secondary | ICD-10-CM | POA: Diagnosis not present

## 2014-12-11 DIAGNOSIS — C9002 Multiple myeloma in relapse: Secondary | ICD-10-CM | POA: Diagnosis not present

## 2014-12-11 DIAGNOSIS — Z7401 Bed confinement status: Secondary | ICD-10-CM

## 2014-12-11 DIAGNOSIS — R64 Cachexia: Secondary | ICD-10-CM

## 2014-12-11 DIAGNOSIS — R634 Abnormal weight loss: Secondary | ICD-10-CM

## 2014-12-11 DIAGNOSIS — Z79899 Other long term (current) drug therapy: Secondary | ICD-10-CM

## 2014-12-11 DIAGNOSIS — I10 Essential (primary) hypertension: Secondary | ICD-10-CM | POA: Diagnosis not present

## 2014-12-11 DIAGNOSIS — Z859 Personal history of malignant neoplasm, unspecified: Secondary | ICD-10-CM

## 2014-12-11 DIAGNOSIS — K219 Gastro-esophageal reflux disease without esophagitis: Secondary | ICD-10-CM | POA: Diagnosis not present

## 2014-12-11 DIAGNOSIS — Z993 Dependence on wheelchair: Secondary | ICD-10-CM | POA: Diagnosis not present

## 2014-12-11 DIAGNOSIS — R110 Nausea: Secondary | ICD-10-CM | POA: Diagnosis not present

## 2014-12-11 LAB — COMPREHENSIVE METABOLIC PANEL
ALBUMIN: 2.8 g/dL — AB (ref 3.5–5.0)
ALT: 20 U/L (ref 14–54)
ANION GAP: 4 — AB (ref 5–15)
AST: 70 U/L — AB (ref 15–41)
Alkaline Phosphatase: 98 U/L (ref 38–126)
BILIRUBIN TOTAL: 0.9 mg/dL (ref 0.3–1.2)
BUN: 12 mg/dL (ref 6–20)
CO2: 28 mmol/L (ref 22–32)
Calcium: 7.6 mg/dL — ABNORMAL LOW (ref 8.9–10.3)
Chloride: 94 mmol/L — ABNORMAL LOW (ref 101–111)
Creatinine, Ser: 1.08 mg/dL — ABNORMAL HIGH (ref 0.44–1.00)
GFR calc Af Amer: 58 mL/min — ABNORMAL LOW (ref >60)
GFR calc non Af Amer: 50 mL/min — ABNORMAL LOW (ref >60)
GLUCOSE: 227 mg/dL — AB (ref 65–99)
POTASSIUM: 3.5 mmol/L (ref 3.5–5.1)
SODIUM: 126 mmol/L — AB (ref 135–145)
TOTAL PROTEIN: 10.6 g/dL — AB (ref 6.5–8.1)

## 2014-12-11 LAB — SAMPLE TO BLOOD BANK

## 2014-12-11 LAB — CBC WITH DIFFERENTIAL/PLATELET
BASOS ABS: 0 10*3/uL (ref 0–0.1)
BASOS PCT: 0 %
EOS ABS: 0 10*3/uL (ref 0–0.7)
Eosinophils Relative: 0 %
HEMATOCRIT: 33.6 % — AB (ref 35.0–47.0)
HEMOGLOBIN: 11.3 g/dL — AB (ref 12.0–16.0)
Lymphocytes Relative: 31 %
Lymphs Abs: 0.7 10*3/uL — ABNORMAL LOW (ref 1.0–3.6)
MCH: 32.6 pg (ref 26.0–34.0)
MCHC: 33.7 g/dL (ref 32.0–36.0)
MCV: 96.7 fL (ref 80.0–100.0)
Monocytes Absolute: 0.1 10*3/uL — ABNORMAL LOW (ref 0.2–0.9)
Monocytes Relative: 5 %
NEUTROS ABS: 1.5 10*3/uL (ref 1.4–6.5)
NEUTROS PCT: 64 %
Platelets: 76 10*3/uL — ABNORMAL LOW (ref 150–440)
RBC: 3.48 MIL/uL — ABNORMAL LOW (ref 3.80–5.20)
RDW: 22.4 % — AB (ref 11.5–14.5)
WBC: 2.3 10*3/uL — AB (ref 3.6–11.0)

## 2014-12-11 MED ORDER — MORPHINE SULFATE (PF) 2 MG/ML IV SOLN
INTRAVENOUS | Status: AC
  Filled 2014-12-11: qty 1

## 2014-12-11 MED ORDER — MORPHINE SULFATE (CONCENTRATE) 20 MG/ML PO SOLN
ORAL | Status: DC
Start: 2014-12-11 — End: 2014-12-22

## 2014-12-11 MED ORDER — MORPHINE SULFATE (PF) 2 MG/ML IV SOLN
2.0000 mg | Freq: Once | INTRAVENOUS | Status: AC
  Administered 2014-12-11: 2 mg via INTRAMUSCULAR
  Filled 2014-12-11: qty 1

## 2014-12-11 MED ORDER — MORPHINE SULFATE ER 30 MG PO TBCR: 30.0000 mg | EXTENDED_RELEASE_TABLET | Freq: Two times a day (BID) | ORAL | Status: DC

## 2014-12-11 NOTE — Progress Notes (Signed)
Unable to get patient up to scale, is feeling very weak and nauseated.  Arrived in wheelchair.  Patient reported that her bottom was sore, examined for possible pressure ulcer.  Skin over coccyx is intact, preventive sacral dressing applied patient instructed to removed if it becomes soiled

## 2014-12-11 NOTE — Progress Notes (Signed)
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 I reviewed the patient's prior chart/pertinent labs/imaging in detail; findings are summarized.  Patient comes to the clinic for follow-up of multiple myeloma/also worsening pain in the right thigh. Pain is fairly intense; she has been taking morphine concentrated liquid every 1-2 hours; along with long-acting fentanyl 150 g.   She has fair appetite; losing weight. Positive for nausea no vomiting. Positive for constipation.  REVIEW OF SYSTEMS:  A complete 10 point review of system is done which is negative except mentioned above/history of present illness.   PAST MEDICAL HISTORY :  Past Medical History  Diagnosis Date  . Multiple myeloma     relapse of myeloma was August 25, 2008  . Hypertension   . GERD (gastroesophageal reflux disease)     PAST SURGICAL HISTORY :   Past Surgical History  Procedure Laterality Date  . Abdominal hysterectomy    . Appendectomy      FAMILY HISTORY :  No family history on file.  SOCIAL HISTORY:   Social History  Substance Use Topics  . Smoking status: Never Smoker   . Smokeless tobacco: Never Used  . Alcohol Use: No    ALLERGIES:  is allergic to treanda.  MEDICATIONS:  Current Outpatient Prescriptions  Medication  Sig Dispense Refill  . acetaminophen (TYLENOL) 500 MG tablet Take 1 tablet (500 mg total) by mouth every 6 (six) hours as needed (1 to 2 tabs max every 6 hours). 30 tablet 0  . dexamethasone (DECADRON) 4 MG tablet Take 4 mg by mouth once a week.    . fentaNYL (DURAGESIC - DOSED MCG/HR) 100 MCG/HR Place 1 patch (100 mcg total) onto the skin every 3 (three) days. Dr Ma Hillock 10 patch 0  . fentaNYL (DURAGESIC - DOSED MCG/HR) 50 MCG/HR Place 1 patch (50 mcg total) onto the skin every 3 (three) days. 10 patch 0  . metroNIDAZOLE (FLAGYL) 500 MG tablet Take 1 tablet (500 mg total) by mouth 3 (three) times daily. 21 tablet 0  . morphine (ROXANOL) 20 MG/ML concentrated solution 1 - 1.5 mL  (20 - 30 mg) orally once every 1 - 2 hours as needed for pain. 60 mL 0  . ondansetron (ZOFRAN) 4 MG tablet Take 4 mg by mouth every 4 (four) hours as needed for nausea or vomiting.    . panobinostat lactate (FARYDAK) 20 MG capsule Swallow whole. Take 1 capsule every Monday, Wednesday, Friday on week 1 and week 2, rest during week 3  (2 weeks on and 1 week off). 12 capsule 4  . polyethylene glycol (MIRALAX / GLYCOLAX) packet Take 17 g by mouth daily.    . potassium chloride 20 MEQ TBCR Take 1 pill twice a day for 4 days and then once daily 30 tablet  0  . senna (SENOKOT) 8.6 MG tablet Take 2 tablets by mouth daily.    Marland Kitchen sulfamethoxazole-trimethoprim (BACTRIM DS,SEPTRA DS) 800-160 MG per tablet Take 1 tablet by mouth 2 (two) times daily. 14 tablet 0  . tiZANidine (ZANAFLEX) 2 MG tablet TAKE 1 TABLET (2 MG TOTAL) BY MOUTH EVERY 8 (EIGHT) HOURS AS NEEDED FOR MUSCLE SPASMS. 90 tablet 0  . morphine (MS CONTIN) 30 MG 12 hr tablet Take 1 tablet (30 mg total) by mouth every 12 (twelve) hours. 60 tablet 0   No current facility-administered medications for this visit.    PHYSICAL EXAMINATION: ECOG PERFORMANCE STATUS: 3 - Symptomatic, >50% confined to bed  BP 110/74 mmHg  Pulse 111  Temp(Src) 98.2 F (36.8 C) (Tympanic)  SpO2  92%  There were no vitals filed for this visit.  GENERAL: Cachectic appearing thin built ; Alert, mild distress and uncomfortable in pain.   Accompanied by sister-in-law and husband. The patient is sitting in a wheelchair hence exam is limited. EYES: Positive for pallor OROPHARYNX: no thrush or ulceration; poor dentition NECK: supple, no masses felt LYMPH:  no palpable lymphadenopathy in the cervical, axillary regions LUNGS: clear to auscultation and  No wheeze or crackles HEART/CVS: regular rate & rhythm and no murmurs; No lower extremity edema ABDOMEN:abdomen soft, non-tender and normal bowel sounds Musculoskeletal:no cyanosis of digits and no clubbing  PSYCH: alert & oriented x 3 with fluent speech NEURO: no focal motor/sensory deficits SKIN:  no rashes or significant lesions  LABORATORY DATA:  I have reviewed the data as listed    Component Value Date/Time   NA 126* 12/11/2014 1119   NA 133* 06/12/2014 1034   K 3.5 12/11/2014 1119   K 3.9 06/12/2014 1034   CL 94* 12/11/2014 1119   CL 92* 06/12/2014 1034   CO2 28 12/11/2014 1119   CO2 34* 06/12/2014 1034   GLUCOSE 227* 12/11/2014 1119   GLUCOSE 129* 06/12/2014 1034   BUN 12 12/11/2014 1119   BUN 13 06/12/2014 1034   CREATININE 1.08* 12/11/2014 1119   CREATININE 0.85 06/12/2014 1034   CALCIUM 7.6* 12/11/2014 1119   CALCIUM 8.6* 06/12/2014 1034   PROT 10.6* 12/11/2014 1119   PROT 8.8* 06/12/2014 1034   ALBUMIN 2.8* 12/11/2014 1119   ALBUMIN 2.5* 06/12/2014 1034   AST 70* 12/11/2014 1119   AST 38 06/12/2014 1034   ALT 20 12/11/2014 1119   ALT 31 06/12/2014 1034   ALKPHOS 98 12/11/2014 1119   ALKPHOS 181* 06/12/2014 1034   BILITOT 0.9 12/11/2014 1119   BILITOT 0.5 06/12/2014 1034   GFRNONAA 50* 12/11/2014 1119   GFRNONAA >60 06/12/2014 1034   GFRNONAA 52* 03/21/2014 0535   GFRAA 58* 12/11/2014 1119   GFRAA >60 06/12/2014 1034   GFRAA >60 03/21/2014 0535    No results found for: SPEP, UPEP  Lab Results   Component Value Date   WBC 2.3* 12/11/2014   NEUTROABS 1.5 12/11/2014   HGB 11.3* 12/11/2014   HCT 33.6* 12/11/2014   MCV 96.7 12/11/2014   PLT 76* 12/11/2014      Chemistry      Component Value Date/Time   NA 126* 12/11/2014 1119   NA 133* 06/12/2014 1034   K 3.5 12/11/2014 1119   K 3.9 06/12/2014 1034   CL 94* 12/11/2014 1119   CL 92* 06/12/2014 1034   CO2 28 12/11/2014 1119   CO2 34* 06/12/2014 1034   BUN 12 12/11/2014 1119   BUN 13 06/12/2014 1034  CREATININE 1.08* 12/11/2014 1119   CREATININE 0.85 06/12/2014 1034      Component Value Date/Time   CALCIUM 7.6* 12/11/2014 1119   CALCIUM 8.6* 06/12/2014 1034   ALKPHOS 98 12/11/2014 1119   ALKPHOS 181* 06/12/2014 1034   AST 70* 12/11/2014 1119   AST 38 06/12/2014 1034   ALT 20 12/11/2014 1119   ALT 31 06/12/2014 1034   BILITOT 0.9 12/11/2014 1119   BILITOT 0.5 06/12/2014 1034       RADIOGRAPHIC STUDIES: I have personally reviewed the radiological images as listed and agreed with the findings in the report. No results found.   ASSESSMENT & PLAN:  # Refractory/progressive multiple myeloma- status post multiple lines of therapy ; poor performance status ECOG 3/declining.  Poor tolerance to therapy/Farydak. Currently on hold for the last 3 weeks.   # Worsening pain not well controlled especially in the right thigh.  Recommendations:   # Given the poor tolerance to therapy/ progressive disease in the context of worsening performance status- I do not think doing further therapies is going to benefit the patient. I recommend hospice for symptom control.  # Pain control- I recommend slow weaning off of fentanyl patch [patient does not have much fat for absorption]; recommend long-acting MS Contin 30 mg twice a day; and ramp-up as we are leaving her of the fentanyl patch. Also recommend continued morphine short-acting. For acute pain I recommend- morphine IM 2 mg.  # I had a long discussion the patient and the family  regarding hospice; and after a lengthy discussion patient agrees to go on hospice.  No orders of the defined types were placed in this encounter.   All questions were answered. The patient knows to call the clinic with any problems, questions or concerns. No barriers to learning was detected.  Spent 45 minutes with the patient face to face with more than 50% of time spent in counseling and coordination of above medical care.     Cammie Sickle, MD 12/11/2014 1:34 PM

## 2014-12-12 ENCOUNTER — Inpatient Hospital Stay: Payer: Medicare Other

## 2014-12-16 ENCOUNTER — Ambulatory Visit: Payer: Medicare Other | Admitting: Internal Medicine

## 2014-12-17 ENCOUNTER — Telehealth: Payer: Self-pay | Admitting: *Deleted

## 2014-12-17 NOTE — Telephone Encounter (Signed)
Pin uncontrolled by MS COntin 30 mg q 12 h asking if it can be increased to q 8 h as she is using the roxanol every hour

## 2014-12-17 NOTE — Telephone Encounter (Signed)
Per Dr Rogue Bussing, ok to increase dose to q 8 h Crystal informed

## 2014-12-22 ENCOUNTER — Other Ambulatory Visit: Payer: Self-pay | Admitting: Internal Medicine

## 2014-12-22 ENCOUNTER — Other Ambulatory Visit: Payer: Self-pay | Admitting: *Deleted

## 2014-12-22 DIAGNOSIS — Z859 Personal history of malignant neoplasm, unspecified: Secondary | ICD-10-CM

## 2014-12-22 DIAGNOSIS — C9000 Multiple myeloma not having achieved remission: Secondary | ICD-10-CM

## 2014-12-22 MED ORDER — MORPHINE SULFATE (CONCENTRATE) 20 MG/ML PO SOLN: ORAL | Status: DC

## 2014-12-22 NOTE — Telephone Encounter (Signed)
Reports that pt needs Roxanol is out. She has used 60 ml since 10/27. Also asking for MS 30 mg which was increased to tid Friday she still has 28 tabs left)

## 2014-12-22 NOTE — Telephone Encounter (Signed)
Roxanol refilled and faxed. [place] notified

## 2014-12-25 ENCOUNTER — Other Ambulatory Visit: Payer: Medicare Other

## 2014-12-25 ENCOUNTER — Ambulatory Visit: Payer: Medicare Other

## 2014-12-26 ENCOUNTER — Telehealth: Payer: Self-pay | Admitting: *Deleted

## 2014-12-26 NOTE — Telephone Encounter (Signed)
Received hospice call yesterday. Patient only taking 20 mg of Roxanol every 1-2 hours. Rn calling to see if this can be increased. Explained that the RX states that the patient can take 20-30 mg (1-1.5 ml) every 1-2 hours for pain. I explained this to [place], RN with Hospice. Dionisio Paschal will call the patient back and instruct her the patient to take the 30 mg every 1-2 hours for pain.

## 2014-12-29 ENCOUNTER — Telehealth: Payer: Self-pay | Admitting: *Deleted

## 2014-12-29 ENCOUNTER — Other Ambulatory Visit: Payer: Self-pay | Admitting: *Deleted

## 2014-12-29 DIAGNOSIS — Z859 Personal history of malignant neoplasm, unspecified: Secondary | ICD-10-CM

## 2014-12-29 DIAGNOSIS — C9000 Multiple myeloma not having achieved remission: Secondary | ICD-10-CM

## 2014-12-29 MED ORDER — MORPHINE SULFATE ER 30 MG PO TBCR: 30.0000 mg | EXTENDED_RELEASE_TABLET | Freq: Two times a day (BID) | ORAL | Status: DC

## 2014-12-29 MED ORDER — MORPHINE SULFATE (CONCENTRATE) 20 MG/ML PO SOLN: ORAL | Status: DC

## 2014-12-29 MED ORDER — MORPHINE SULFATE ER 15 MG PO TBCR: 15.0000 mg | EXTENDED_RELEASE_TABLET | Freq: Two times a day (BID) | ORAL | Status: DC

## 2014-12-29 NOTE — Telephone Encounter (Signed)
Increase MS Contin to 45 mg q 8 h and refill Roxanol per Dr Zane Herald informed and rx faxed

## 2014-12-29 NOTE — Telephone Encounter (Signed)
[facility] called to confirm that biotherapy rx was d/c. Called back. Faradax was d/c as patient is in hospice.

## 2014-12-29 NOTE — Telephone Encounter (Signed)
She is using Roxaol 30 mg every 2 hours. She got #60 ml on 12/22/14

## 2015-01-01 ENCOUNTER — Telehealth: Payer: Self-pay | Admitting: *Deleted

## 2015-01-01 MED ORDER — POTASSIUM CHLORIDE ER 20 MEQ PO TBCR: 20.0000 meq | EXTENDED_RELEASE_TABLET | Freq: Every day | ORAL | Status: AC

## 2015-01-01 NOTE — Telephone Encounter (Signed)
Escribed

## 2015-01-05 ENCOUNTER — Telehealth: Payer: Self-pay | Admitting: *Deleted

## 2015-01-05 MED ORDER — TIZANIDINE HCL 2 MG PO TABS: ORAL_TABLET | ORAL | Status: AC

## 2015-01-05 NOTE — Telephone Encounter (Signed)
Notified that both rx was just faxed on 11/14 for 2 week supply of 15 and 30 mg tabs She also reports that they started her on Temazepam per protocol fpr sleep

## 2015-01-05 NOTE — Telephone Encounter (Signed)
Escribed

## 2015-01-15 ENCOUNTER — Other Ambulatory Visit: Payer: Self-pay | Admitting: Internal Medicine

## 2015-01-15 ENCOUNTER — Other Ambulatory Visit: Payer: Self-pay | Admitting: *Deleted

## 2015-01-15 DIAGNOSIS — C9000 Multiple myeloma not having achieved remission: Secondary | ICD-10-CM

## 2015-01-15 DIAGNOSIS — Z859 Personal history of malignant neoplasm, unspecified: Secondary | ICD-10-CM

## 2015-01-15 DIAGNOSIS — R1031 Right lower quadrant pain: Secondary | ICD-10-CM

## 2015-01-15 MED ORDER — METRONIDAZOLE 500 MG PO TABS: 500.0000 mg | ORAL_TABLET | Freq: Three times a day (TID) | ORAL | Status: AC

## 2015-01-15 MED ORDER — AMOXICILLIN 500 MG PO CAPS: 500.0000 mg | ORAL_CAPSULE | Freq: Three times a day (TID) | ORAL | Status: AC

## 2015-01-15 MED ORDER — MORPHINE SULFATE ER 15 MG PO TBCR: 15.0000 mg | EXTENDED_RELEASE_TABLET | Freq: Two times a day (BID) | ORAL | Status: DC

## 2015-01-15 MED ORDER — MORPHINE SULFATE ER 30 MG PO TBCR: 30.0000 mg | EXTENDED_RELEASE_TABLET | Freq: Three times a day (TID) | ORAL | Status: DC

## 2015-01-15 MED ORDER — MORPHINE SULFATE ER 15 MG PO TBCR: 15.0000 mg | EXTENDED_RELEASE_TABLET | Freq: Three times a day (TID) | ORAL | Status: DC

## 2015-01-15 MED ORDER — MORPHINE SULFATE (CONCENTRATE) 20 MG/ML PO SOLN: ORAL | Status: DC

## 2015-01-15 MED ORDER — MORPHINE SULFATE ER 30 MG PO TBCR: 30.0000 mg | EXTENDED_RELEASE_TABLET | Freq: Two times a day (BID) | ORAL | Status: DC

## 2015-01-15 NOTE — Telephone Encounter (Signed)
Called to report that she completed Keflex yesterday and she still has swelling and heat in her jaw. Not sure if it is from MM or an abcess. Asking if you want to reorder abx. Also needing refills on MS Contin and Roxanol

## 2015-01-15 NOTE — Telephone Encounter (Signed)
Narcotic rx faxed, Notified Crystal of new abx that were e scribed

## 2015-01-16 ENCOUNTER — Telehealth: Payer: Self-pay | Admitting: *Deleted

## 2015-01-16 MED ORDER — NYSTATIN 100000 UNIT/ML MT SUSP: 5.0000 mL | Freq: Four times a day (QID) | OROMUCOSAL | Status: AC

## 2015-01-16 NOTE — Telephone Encounter (Signed)
Escribed

## 2015-01-17 DIAGNOSIS — E119 Type 2 diabetes mellitus without complications: Secondary | ICD-10-CM | POA: Diagnosis not present

## 2015-01-27 ENCOUNTER — Telehealth: Payer: Self-pay | Admitting: *Deleted

## 2015-01-27 ENCOUNTER — Other Ambulatory Visit: Payer: Self-pay | Admitting: Internal Medicine

## 2015-01-27 DIAGNOSIS — C9000 Multiple myeloma not having achieved remission: Secondary | ICD-10-CM

## 2015-01-27 DIAGNOSIS — Z859 Personal history of malignant neoplasm, unspecified: Secondary | ICD-10-CM

## 2015-01-27 DIAGNOSIS — R1031 Right lower quadrant pain: Secondary | ICD-10-CM

## 2015-01-27 MED ORDER — MORPHINE SULFATE ER 30 MG PO TBCR: EXTENDED_RELEASE_TABLET | ORAL | Status: DC

## 2015-01-27 MED ORDER — MORPHINE SULFATE ER 15 MG PO TBCR: 15.0000 mg | EXTENDED_RELEASE_TABLET | Freq: Three times a day (TID) | ORAL | Status: DC

## 2015-01-27 MED ORDER — MORPHINE SULFATE (CONCENTRATE) 20 MG/ML PO SOLN: ORAL | Status: DC

## 2015-01-27 NOTE — Telephone Encounter (Signed)
Having left jaw pain from suspected dental issue, can anything be done about it?

## 2015-01-30 ENCOUNTER — Telehealth: Payer: Self-pay | Admitting: *Deleted

## 2015-01-30 NOTE — Telephone Encounter (Signed)
Per Dr B, wants CBC, PT, PTT drawn before he makes that decision. I called order to Triage nurse at [facility], Ivin Booty, then I notified pt of this and she thanked me for this

## 2015-01-30 NOTE — Telephone Encounter (Addendum)
Asking if it would be ok to have a tooth pulled

## 2015-02-03 ENCOUNTER — Other Ambulatory Visit: Payer: Self-pay | Admitting: Internal Medicine

## 2015-02-03 ENCOUNTER — Telehealth: Payer: Self-pay | Admitting: *Deleted

## 2015-02-03 DIAGNOSIS — C9000 Multiple myeloma not having achieved remission: Secondary | ICD-10-CM

## 2015-02-03 DIAGNOSIS — R1031 Right lower quadrant pain: Secondary | ICD-10-CM

## 2015-02-03 DIAGNOSIS — Z859 Personal history of malignant neoplasm, unspecified: Secondary | ICD-10-CM

## 2015-02-03 MED ORDER — MORPHINE SULFATE ER 15 MG PO TBCR: 15.0000 mg | EXTENDED_RELEASE_TABLET | Freq: Three times a day (TID) | ORAL | Status: DC

## 2015-02-03 MED ORDER — MORPHINE SULFATE ER 30 MG PO TBCR: 30.0000 mg | EXTENDED_RELEASE_TABLET | Freq: Three times a day (TID) | ORAL | Status: DC

## 2015-02-03 MED ORDER — MORPHINE SULFATE (CONCENTRATE) 20 MG/ML PO SOLN: ORAL | Status: DC

## 2015-02-03 NOTE — Telephone Encounter (Signed)
Hospice unable to draw patient's labs. Pt has poor venous access and no port. Patient has an abscess tooth vs jaw necrosis. The patient made an apt with the dentist to have her tooth pulled. "Does Dr. Rogue Bussing needs these labs?"  I told hospice nurse that most dentist will not pull a tooth w/o having labs. From the oncologist perspective, have hospice as the patient what she would like to have done. If patient desires to proceed with lab draws, maybe the nurse can re-attempt the lab draw the next day.  The patient may elect not to be stuck. Ultimately, the decision to attempt lab draws will be the patient's.  Hospice nurse will ask the patient what she would like to have done.  The hospice nurse states that the patient will need a RF on the lqd roxanol and mscontin 15 and 30 mg tablets by Thursday.   I explained that Dr. Rogue Bussing will be out of the office on Thursday. MD will send RX to pharmacy and date pick up for Thursday's date.

## 2015-02-12 ENCOUNTER — Encounter: Payer: Self-pay | Admitting: Internal Medicine

## 2015-02-18 ENCOUNTER — Telehealth: Payer: Self-pay | Admitting: *Deleted

## 2015-02-18 NOTE — Telephone Encounter (Signed)
Pt called to inquire about her lab results from hospice. She was finally able to get these drawn on 02/12/15. The results were received on Tuesday 02/17/15 in the afternoon. Md has not yet reviewed. Pt would like to plan for her tooth extraction with Dr. Barnie Alderman in [place]. She does not have an apt yet at this time with Dr. Barnie Alderman. I explained to Joyce Schultz that her hospice labs demonstrated that she was neutropenic ANC is 0.9. plt count is 91. hgb 7.3 and her pt/inr is 1.4 ptt is 36.  Given these lab values, it would be advisable to hold off on any dental extractions. However, she can have her dentist examine her tooth to evaluate if there is a gum infection but the dentist is not able to remove any teeth. She is ultimately at risk for infection and bleeding. I explained to the patient that Dr. Rogue Bussing was concerned that this may be jaw necrosis r/t her multiple myeloma.  I will contact her dentist office tomorrow and fax these labs to Dr. Zorita Pang office. I will communicate this msg back to her hospice nurse. Dr. Rogue Bussing will be out of the office until 02/23/15. We can review her labs with Dr. B at that time.

## 2015-02-24 NOTE — Telephone Encounter (Signed)
I agree with dental evaluation; hold off any extraction until the dentists discusses with me- given the abnormal blood counts.

## 2015-03-02 ENCOUNTER — Telehealth: Payer: Self-pay | Admitting: *Deleted

## 2015-03-02 NOTE — Telephone Encounter (Signed)
Left msg for Dental office to contact our office. Need to know if patient made a dental apt. If so, cancer center needs to discuss abn labs and clinical history with office before any procedures are performed.

## 2015-03-03 ENCOUNTER — Other Ambulatory Visit: Payer: Self-pay | Admitting: Internal Medicine

## 2015-03-03 ENCOUNTER — Other Ambulatory Visit: Payer: Self-pay | Admitting: *Deleted

## 2015-03-03 DIAGNOSIS — C9000 Multiple myeloma not having achieved remission: Secondary | ICD-10-CM

## 2015-03-03 DIAGNOSIS — R1031 Right lower quadrant pain: Secondary | ICD-10-CM

## 2015-03-03 DIAGNOSIS — Z859 Personal history of malignant neoplasm, unspecified: Secondary | ICD-10-CM

## 2015-03-03 MED ORDER — MORPHINE SULFATE ER 15 MG PO TBCR: 15.0000 mg | EXTENDED_RELEASE_TABLET | Freq: Three times a day (TID) | ORAL | Status: DC

## 2015-03-03 MED ORDER — MORPHINE SULFATE (CONCENTRATE) 20 MG/ML PO SOLN: ORAL | Status: AC

## 2015-03-03 MED ORDER — MORPHINE SULFATE ER 30 MG PO TBCR: 30.0000 mg | EXTENDED_RELEASE_TABLET | Freq: Three times a day (TID) | ORAL | Status: DC

## 2015-03-03 NOTE — Telephone Encounter (Signed)
Please fax them to [facility] in [place]

## 2015-03-16 ENCOUNTER — Telehealth: Payer: Self-pay | Admitting: *Deleted

## 2015-03-16 DIAGNOSIS — Z859 Personal history of malignant neoplasm, unspecified: Secondary | ICD-10-CM

## 2015-03-16 DIAGNOSIS — R1031 Right lower quadrant pain: Secondary | ICD-10-CM

## 2015-03-16 DIAGNOSIS — C9000 Multiple myeloma not having achieved remission: Secondary | ICD-10-CM

## 2015-03-16 MED ORDER — MORPHINE SULFATE ER 30 MG PO TBCR: 30.0000 mg | EXTENDED_RELEASE_TABLET | Freq: Three times a day (TID) | ORAL | Status: AC

## 2015-03-16 MED ORDER — MORPHINE SULFATE ER 15 MG PO TBCR: 15.0000 mg | EXTENDED_RELEASE_TABLET | Freq: Three times a day (TID) | ORAL | Status: AC

## 2015-03-16 NOTE — Telephone Encounter (Signed)
faxed

## 2015-03-26 ENCOUNTER — Other Ambulatory Visit: Payer: Self-pay | Admitting: Internal Medicine

## 2015-04-15 DEATH — deceased

## 2015-05-06 NOTE — Telephone Encounter (Signed)
erroneous

## 2015-08-05 ENCOUNTER — Other Ambulatory Visit: Payer: Self-pay | Admitting: Nurse Practitioner

## 2016-09-08 IMAGING — CR DG FEMUR 2V*R*
2 series · 2 of 2 positions shown · non-contrast
Comparison: Metastatic bone survey of 03/13/2013

CLINICAL DATA: Right hip and upper leg pain for 1 week, history of
myeloma

EXAM:
RIGHT FEMUR - 2 VIEW

[femur ap]
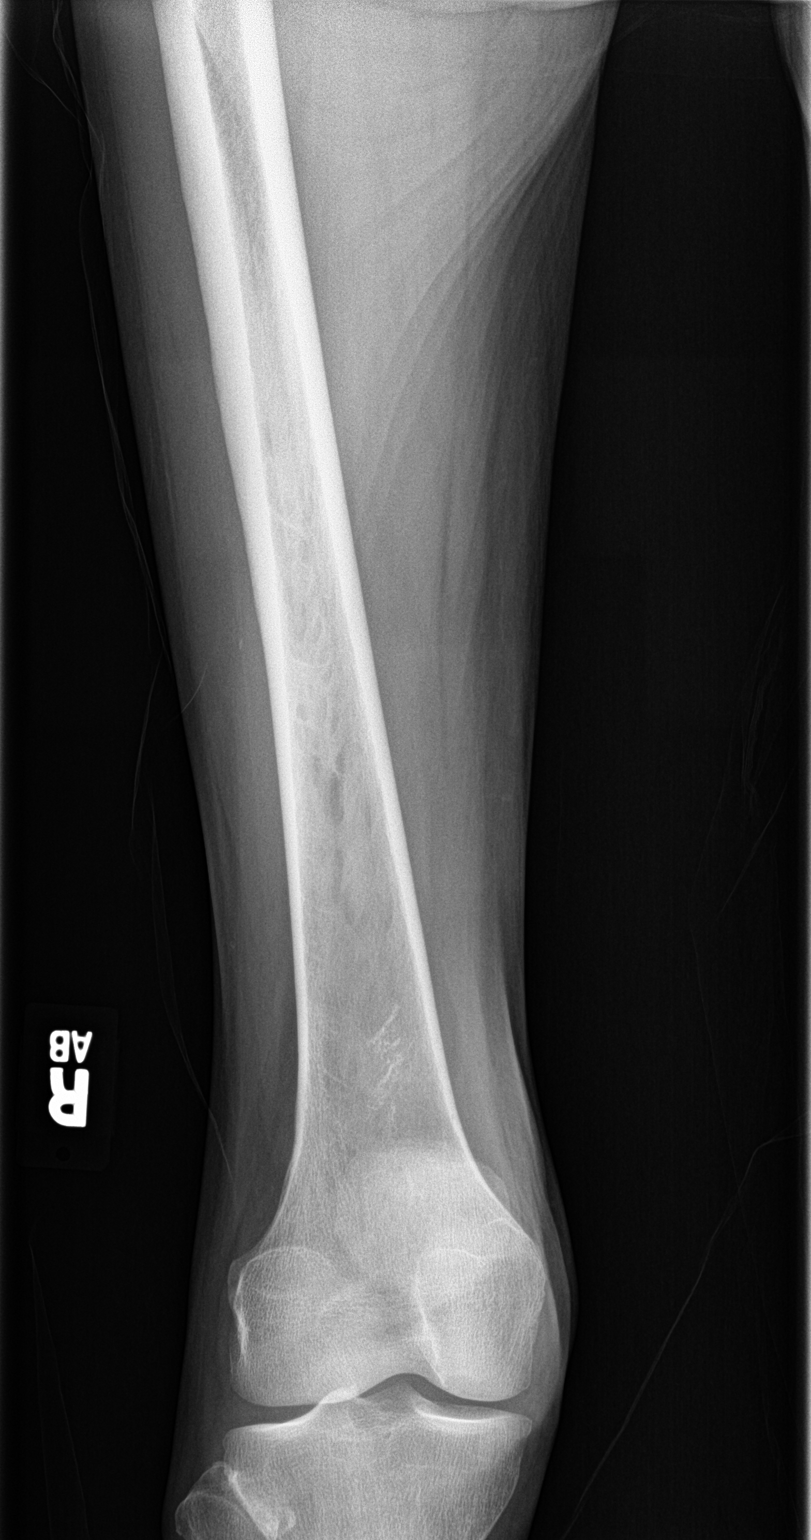

[femur lat]
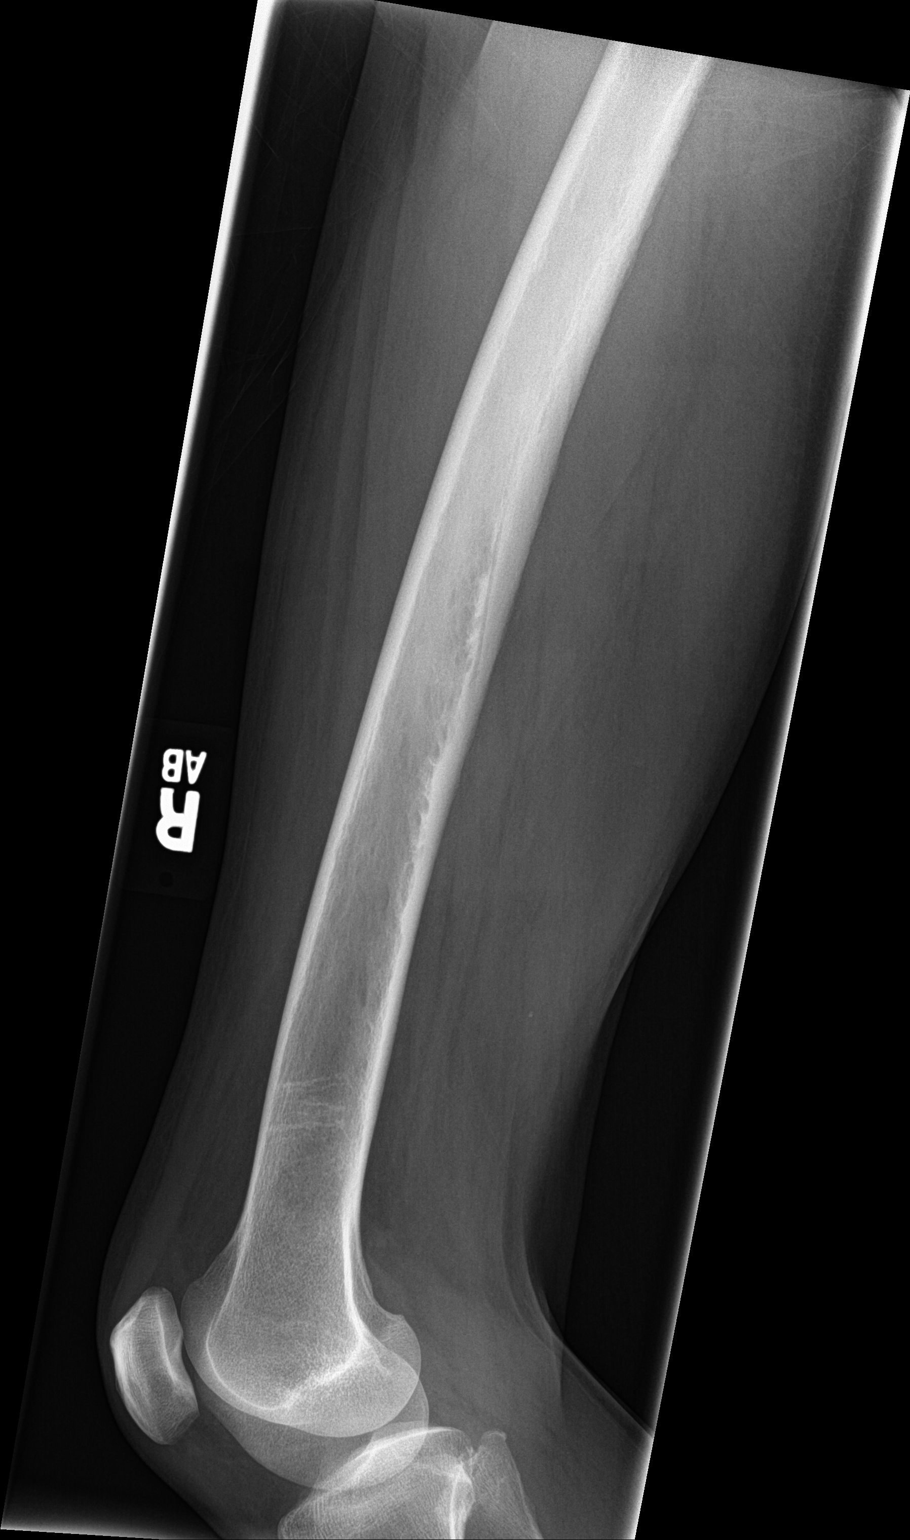

[2 of 2 positions shown; findings below may reference images not displayed]

FINDINGS: Views of the right femur are compared to the metastatic bone survey
from 03/13/2013. There are multiple radiolucent lesions within the
mid distal femoral shaft suspicious for myelomatous deposits.
However these lesions have not changed significantly in the interval
and no impending fracture is seen.
IMPRESSION: Multiple radiolucent lesions in the mid distal right femur
suspicious for myeloma, not significant changed compared to prior
images from 03/13/2013

## 2016-09-08 IMAGING — CR RIGHT HIP - COMPLETE 2+ VIEW
3 series · 3 of 3 positions shown · non-contrast
Comparison: CT 03/28/2013.

CLINICAL DATA: Multiple myeloma.  Pain.

EXAM:
RIGHT HIP - COMPLETE 2+ VIEW

[pelvis ap]
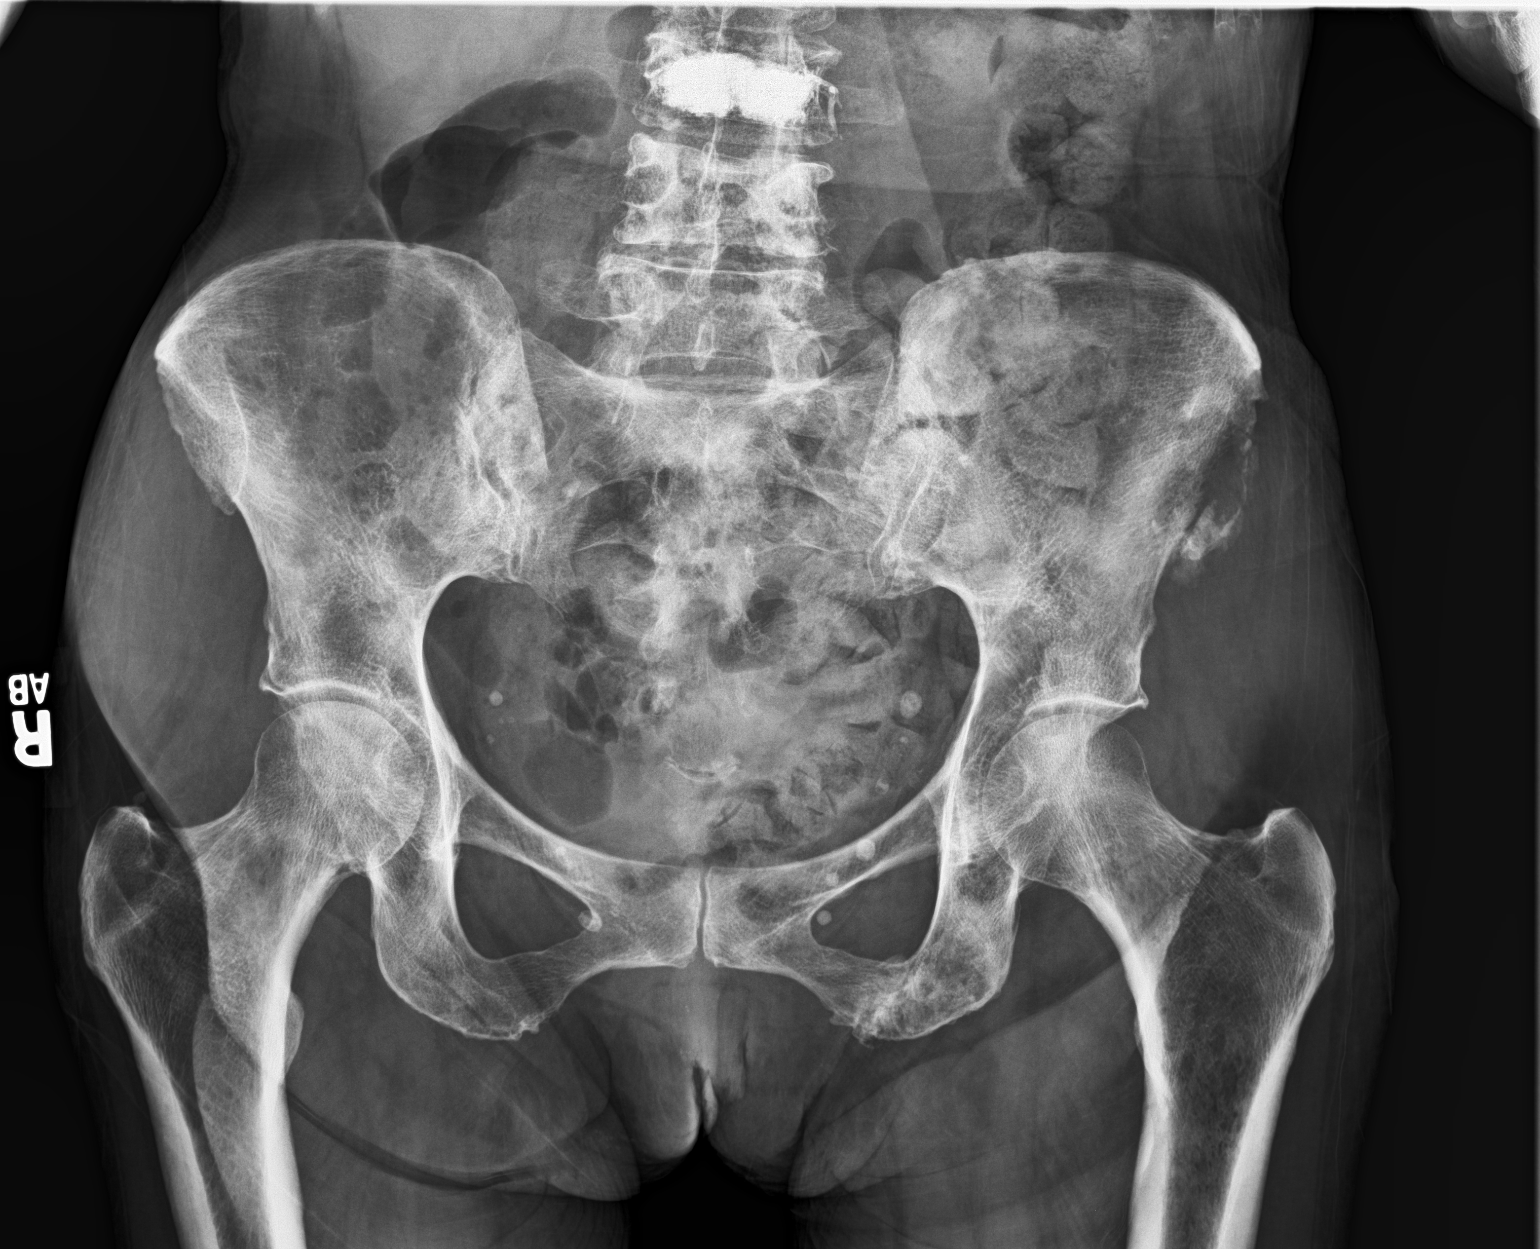

[hip ap]
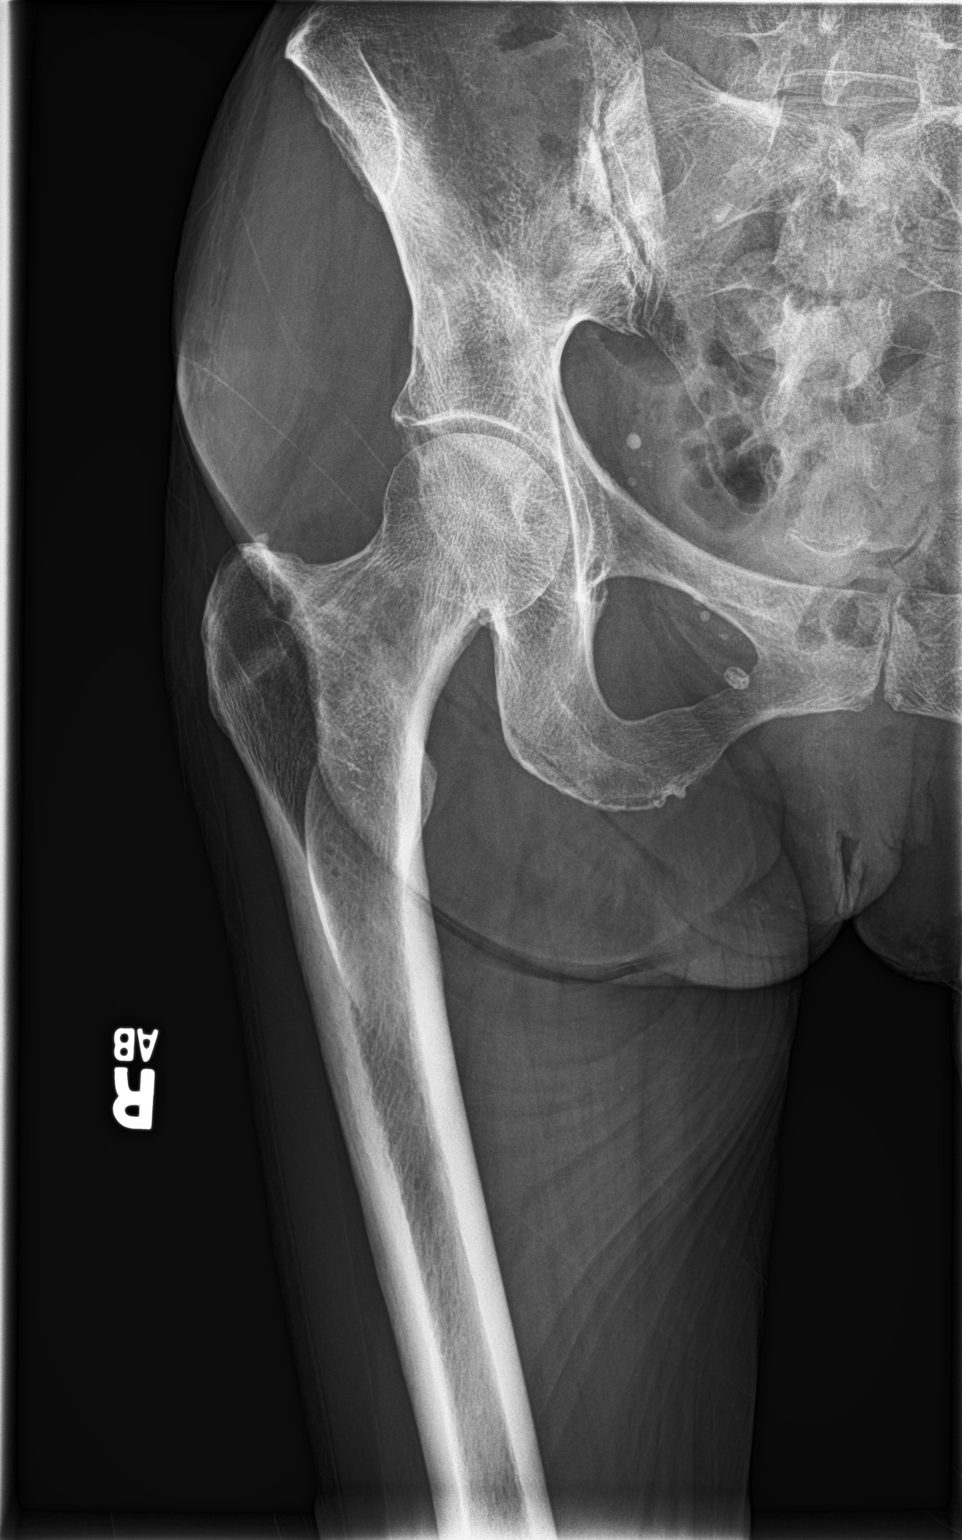

[hip lat]
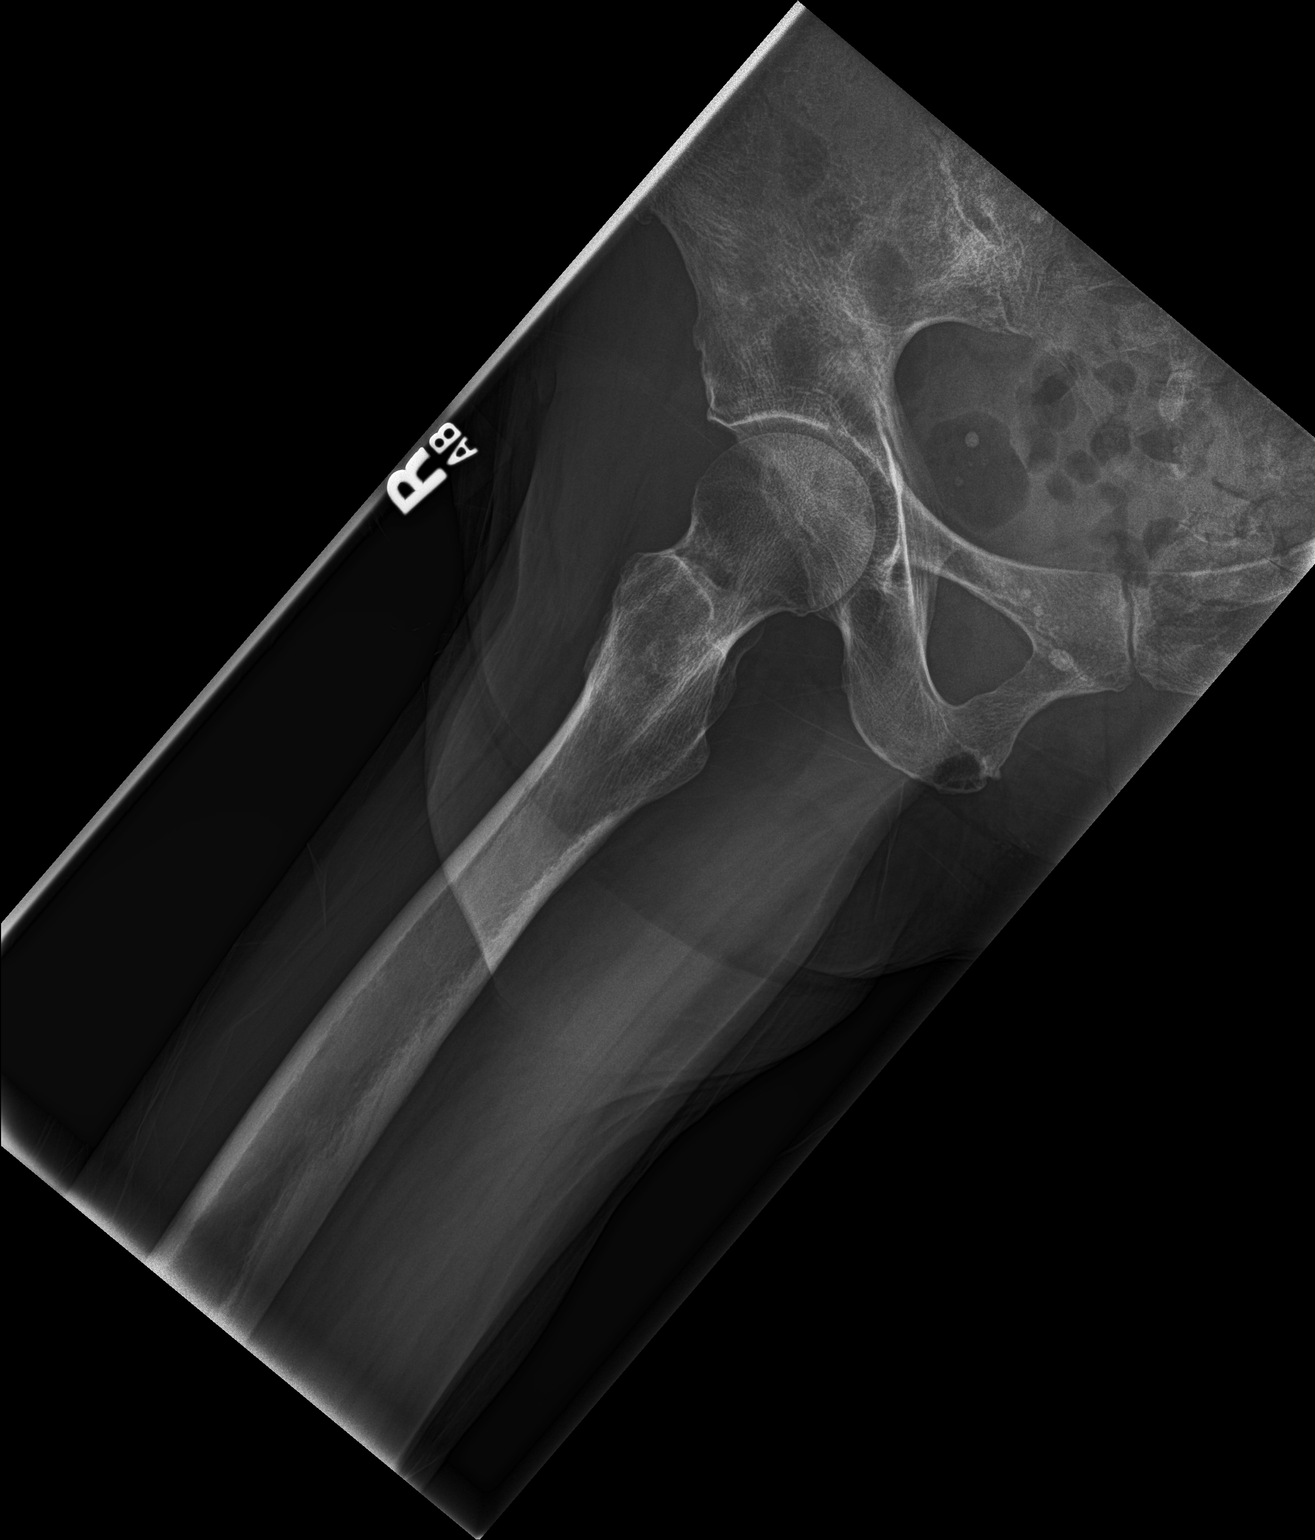

[3 of 3 positions shown; findings below may reference images not displayed]

FINDINGS: There has been substantial interval change in the appearance of the
bony pelvis compared to the prior exam. There is diffuse osteopenia.
Multiple osteolytic lesions are present throughout the pelvic bones.

There is probable plasmacytoma in the LEFT iliac wing, with
peripheral calcification that is probably displaced by expansion of
the iliac bone. This is a new finding compared to prior CT and
osseous survey. There is also cortical irregularity in the inferior
LEFT pubic ramus that suggests a healing pathologic fracture.
Partial visualization of the lumbar spine shows L3 vertebral
augmentation. Diffuse demineralization of the proximal RIGHT femur
without a fracture. No focal plasmacytoma the RIGHT femur is
identified radiographically.
IMPRESSION: 1. Marked interval change in the appearance of the pelvis compared
to exams from last year suggesting diffuse involvement with myeloma,
most prominent at the LEFT iliac crest and LEFT inferior pubic
ramus.
2. No displaced RIGHT hip fracture in this patient with RIGHT hip
pain.
3. An MRI of the pelvis with and without contrast should be
considered to assess for the extent of myeloma in the anatomic
pelvis. This should adequately cover the proximal RIGHT femur in
this patient with RIGHT hip pain.
